# Patient Record
Sex: Male | Born: 1960 | Race: White | Hispanic: No | Marital: Married | Smoking: Never smoker
Health system: Southern US, Community
[De-identification: ages and names within clinical notes are randomized; demographics above are authoritative.]

## PROBLEM LIST (undated history)

## (undated) DIAGNOSIS — I10 Essential (primary) hypertension: Secondary | ICD-10-CM

## (undated) DIAGNOSIS — I639 Cerebral infarction, unspecified: Secondary | ICD-10-CM

## (undated) HISTORY — PX: COLONOSCOPY: SHX174

---

## 2017-01-01 DIAGNOSIS — D1801 Hemangioma of skin and subcutaneous tissue: Secondary | ICD-10-CM | POA: Diagnosis not present

## 2017-01-01 DIAGNOSIS — L570 Actinic keratosis: Secondary | ICD-10-CM | POA: Diagnosis not present

## 2017-07-21 DIAGNOSIS — C44622 Squamous cell carcinoma of skin of right upper limb, including shoulder: Secondary | ICD-10-CM | POA: Diagnosis not present

## 2017-07-21 DIAGNOSIS — L814 Other melanin hyperpigmentation: Secondary | ICD-10-CM | POA: Diagnosis not present

## 2017-07-21 DIAGNOSIS — L570 Actinic keratosis: Secondary | ICD-10-CM | POA: Diagnosis not present

## 2017-07-21 DIAGNOSIS — C44629 Squamous cell carcinoma of skin of left upper limb, including shoulder: Secondary | ICD-10-CM | POA: Diagnosis not present

## 2017-08-06 DIAGNOSIS — D0462 Carcinoma in situ of skin of left upper limb, including shoulder: Secondary | ICD-10-CM | POA: Diagnosis not present

## 2017-08-11 ENCOUNTER — Inpatient Hospital Stay (HOSPITAL_COMMUNITY)
Admission: AD | Admit: 2017-08-11 | Discharge: 2017-08-13 | DRG: 065 | Disposition: A | Discharge status: Home / Self Care | Payer: 59 | Source: Other Acute Inpatient Hospital | Attending: Neurology | Admitting: Neurology

## 2017-08-11 ENCOUNTER — Inpatient Hospital Stay (HOSPITAL_COMMUNITY): Payer: 59

## 2017-08-11 ENCOUNTER — Encounter (HOSPITAL_COMMUNITY): Payer: Self-pay | Admitting: General Practice

## 2017-08-11 DIAGNOSIS — E785 Hyperlipidemia, unspecified: Secondary | ICD-10-CM | POA: Diagnosis present

## 2017-08-11 DIAGNOSIS — Z6838 Body mass index (BMI) 38.0-38.9, adult: Secondary | ICD-10-CM | POA: Diagnosis not present

## 2017-08-11 DIAGNOSIS — R29700 NIHSS score 0: Secondary | ICD-10-CM | POA: Diagnosis present

## 2017-08-11 DIAGNOSIS — E669 Obesity, unspecified: Secondary | ICD-10-CM | POA: Diagnosis present

## 2017-08-11 DIAGNOSIS — T45615A Adverse effect of thrombolytic drugs, initial encounter: Secondary | ICD-10-CM | POA: Diagnosis present

## 2017-08-11 DIAGNOSIS — R1310 Dysphagia, unspecified: Secondary | ICD-10-CM | POA: Diagnosis present

## 2017-08-11 DIAGNOSIS — I69320 Aphasia following cerebral infarction: Secondary | ICD-10-CM | POA: Diagnosis not present

## 2017-08-11 DIAGNOSIS — R471 Dysarthria and anarthria: Secondary | ICD-10-CM | POA: Diagnosis present

## 2017-08-11 DIAGNOSIS — G8191 Hemiplegia, unspecified affecting right dominant side: Secondary | ICD-10-CM | POA: Diagnosis not present

## 2017-08-11 DIAGNOSIS — Z9282 Status post administration of tPA (rtPA) in a different facility within the last 24 hours prior to admission to current facility: Secondary | ICD-10-CM | POA: Diagnosis not present

## 2017-08-11 DIAGNOSIS — Z823 Family history of stroke: Secondary | ICD-10-CM | POA: Diagnosis not present

## 2017-08-11 DIAGNOSIS — R791 Abnormal coagulation profile: Secondary | ICD-10-CM | POA: Diagnosis present

## 2017-08-11 DIAGNOSIS — I161 Hypertensive emergency: Secondary | ICD-10-CM | POA: Diagnosis not present

## 2017-08-11 DIAGNOSIS — I6340 Cerebral infarction due to embolism of unspecified cerebral artery: Principal | ICD-10-CM | POA: Diagnosis present

## 2017-08-11 DIAGNOSIS — R001 Bradycardia, unspecified: Secondary | ICD-10-CM | POA: Diagnosis present

## 2017-08-11 DIAGNOSIS — R29701 NIHSS score 1: Secondary | ICD-10-CM | POA: Diagnosis present

## 2017-08-11 DIAGNOSIS — I160 Hypertensive urgency: Secondary | ICD-10-CM

## 2017-08-11 DIAGNOSIS — R270 Ataxia, unspecified: Secondary | ICD-10-CM | POA: Diagnosis not present

## 2017-08-11 DIAGNOSIS — I10 Essential (primary) hypertension: Secondary | ICD-10-CM | POA: Diagnosis present

## 2017-08-11 DIAGNOSIS — G8194 Hemiplegia, unspecified affecting left nondominant side: Secondary | ICD-10-CM | POA: Diagnosis present

## 2017-08-11 DIAGNOSIS — I6300 Cerebral infarction due to thrombosis of unspecified precerebral artery: Secondary | ICD-10-CM | POA: Diagnosis not present

## 2017-08-11 DIAGNOSIS — R200 Anesthesia of skin: Secondary | ICD-10-CM | POA: Diagnosis not present

## 2017-08-11 DIAGNOSIS — I639 Cerebral infarction, unspecified: Secondary | ICD-10-CM

## 2017-08-11 DIAGNOSIS — R739 Hyperglycemia, unspecified: Secondary | ICD-10-CM | POA: Diagnosis present

## 2017-08-11 LAB — MRSA PCR SCREENING: MRSA BY PCR: NEGATIVE

## 2017-08-11 MED ORDER — ACETAMINOPHEN 325 MG PO TABS
650.0000 mg | ORAL_TABLET | ORAL | Status: DC | PRN
  Administered 2017-08-11 – 2017-08-12 (×2): 650 mg via ORAL
  Filled 2017-08-11 (×2): qty 2

## 2017-08-11 MED ORDER — ACETAMINOPHEN 650 MG RE SUPP: 650.0000 mg | RECTAL | Status: DC | PRN

## 2017-08-11 MED ORDER — ACETAMINOPHEN 160 MG/5ML PO SOLN: 650.0000 mg | ORAL | Status: DC | PRN

## 2017-08-11 MED ORDER — NICARDIPINE HCL IN NACL 20-0.86 MG/200ML-% IV SOLN: 3.0000 mg/h | INTRAVENOUS | Status: DC

## 2017-08-11 MED ORDER — SODIUM CHLORIDE 0.9 % IV SOLN
INTRAVENOUS | Status: DC
  Administered 2017-08-12: 06:00:00 via INTRAVENOUS

## 2017-08-11 MED ORDER — STROKE: EARLY STAGES OF RECOVERY BOOK
Freq: Once | Status: DC
  Filled 2017-08-11: qty 1

## 2017-08-11 MED ORDER — SENNOSIDES-DOCUSATE SODIUM 8.6-50 MG PO TABS: 1.0000 | ORAL_TABLET | Freq: Every evening | ORAL | Status: DC | PRN

## 2017-08-11 NOTE — H&P (Signed)
Stroke Neurology H&P  CC: Transfer from OSH - s/p tPA  History is obtained from: Patient, chart, EMS, [facility] ER telephone call  HPI: Justin [name] is a 56 y.o. male with no past medical history who presented to [facility] with sudden onset of right arm and leg weakness. He reported to the emergency room doctors there that he was working and noticed that he could not move the right hand. He had to focus hard to make his right arm movement. When it started becoming more difficult, that is when he decided to come to the hospital. Also had word finding difficulty. Also complains of headache. He was evaluated by telemedicine neurology or at [facility] ER, and they suspected a stroke. Decision was made to use IV TPA. His systolic blood pressure on presentation was in the 230s for the phone call report. I do not have the telemedicine neurology note to review. TPA bolus was administered, but because of inability to control his blood pressures, decision was made by the team over and round TO not pursue the infusion of TPA. He was transferred to [facility] for higher level of care as they do not have neurology coverage over at [facility].  LKW: 1130 am tpa given?: Bolus given at 1359 hrs-infusion not given because of inability to control blood pressures. This is  report received over the phone Premorbid modified Rankin scale (mRS): 0  ROS: A 14 point ROS was performed and is negative except as noted in the HPI.   No past medical history on file. None  No family history on file. Mother had stroke Sister has had TIAs  Social History:   has no tobacco, alcohol, and drug history on file. Alcohol - 1 beer/day No illicit drugs No tobacco  Medications  Current Facility-Administered Medications:  .   stroke: mapping our early stages of recovery book, , Does not apply, Once, Amie Portland, MD .  0.9 %  sodium chloride infusion, , Intravenous, Continuous, Amie Portland, MD, Last Rate: 75 mL/hr at  08/11/17 1800 .  acetaminophen (TYLENOL) tablet 650 mg, 650 mg, Oral, Q4H PRN **OR** acetaminophen (TYLENOL) solution 650 mg, 650 mg, Per Tube, Q4H PRN **OR** acetaminophen (TYLENOL) suppository 650 mg, 650 mg, Rectal, Q4H PRN, Amie Portland, MD .  nicardipine (CARDENE) 20mg  in 0.86% saline 235ml IV infusion (0.1 mg/ml), 3-15 mg/hr, Intravenous, Continuous, Amie Portland, MD .  senna-docusate (Senokot-S) tablet 1 tablet, 1 tablet, Oral, QHS PRN, Amie Portland, MD   Exam: Current vital signs: BP (!) 177/102   Pulse 88   Temp 97.6 F (36.4 C) (Oral)   Resp 18   Ht 6\' 1"  (1.854 m)   Wt 132 kg (291 lb 0.1 oz)   SpO2 100%   BMI 38.39 kg/m  Vital signs in last 24 hours: Temp:  [97.6 F (36.4 C)] 97.6 F (36.4 C) (10/02 1700) Pulse Rate:  [88-91] 88 (10/02 1800) Resp:  [18-23] 18 (10/02 1800) BP: (162-177)/(89-102) 177/102 (10/02 1800) SpO2:  [97 %-100 %] 100 % (10/02 1800) Weight:  [132 kg (291 lb 0.1 oz)] 132 kg (291 lb 0.1 oz) (10/02 1700)  GENERAL: Awake, alert in NAD HEENT: - Normocephalic and atraumatic, dry mm, no LN++, no Thyromegally LUNGS - Clear to auscultation bilaterally with no wheezes CV - S1S2 RRR, no m/r/g, equal pulses bilaterally. ABDOMEN - Soft, nontender, nondistended with normoactive BS Ext: warm, well perfused, intact peripheral pulses, no edema  NEURO:  Mental Status: AA&Ox3  Language: speech is .  Naming,  repetition, fluency, and comprehension intact Cranial Nerves: PERRL 3 mm/brisk. EOMI, visual fields full, no facial asymmetry, facial sensation intact, hearing intact, tongue/uvula/soft palate midline, normal sternocleidomastoid and trapezius muscle strength. No evidence of tongue atrophy or fibrillations Motor: 5/5 all over Tone: is normal and bulk is normal Sensation- Intact to light touch bilaterally Coordination: FTN intact bilaterally, no ataxia in BLE. Gait- deferred  NIHSS 0    Labs No labs available  Imaging No images available at this  time. Report from outside hospital-noncontrast CT of the head with no evidence of bleed or ischemic changes  Assessment:  56 year old man with no past medical history because he has not seen a doctor in many years, presented to [facility] with sudden onset of right arm and leg weakness. He was evaluated by telemedicine neurology and was given IV TPA-bolus only and then the infusion was aborted because of inability to control his blood pressures, which was running consistently high. He was transferred to [facility] for higher level of care. NIH stroke scale on my evaluation-0.  Impression: Lacunar stroke versus hypertensive urgency  Acuity: Acute Current Suspected Etiology: small vessel disease v HTN Continue Evaluation:  -Admit to: ICU (post tPA protocol although he only received the bolus and not the full dose.) -Blood pressure control, goal of SYS <180 -MRI/ECHO/A1C/Lipid panel. -Hyperglycemia management per SSI to maintain glucose 140-180mg /dL. -PT/OT/ST therapies and recommendations when able  CNS Dysarthria Dysphagia following cerebral infarction  -NPO until cleared by speech -ST -Advance diet as tolerated  Hemiplegia and hemiparesis following cerebral infarction affecting right dominant side -PT/OT  RESP No active issues. Monitor clinically  CV Hypertensive Emergency -Aggressive BP control, goal SBP < 180 -Cardene ordered -Possible underlying essential hypertension that has been untreated or undiagnosed as he has not seen a doctor in many years. -TTE  Hyperlipidemia, unspecified  - Statin for goal LDL < 70  HEME -Monitor -transfuse for hgb < 7  Coagulopathy secondary to tPA at outside hospital -Trend PT/PTT/INR  ENDO -goal HgbA1c < 7  GI/GU -Gentle hydration - and is at 75 an hour  Fluid/Electrolyte Disorders Check labs, replete as necessary.  ID No active issues. 10 vitals and CBC.  Nutrition E66.9 Obesity  -diet  consult  Prophylaxis DVT:  SCDs GI: Not applicable Bowel: Doc- senna  Diet: Clear liquids and advance as tolerated as he is passed bedside swallow.  Code Status: Full Code   -- Amie Portland, MD [facility] [phone]  If 7pm to 7am, please call on call as listed on AMION.  Critical care attestation This patient is critically ill and at significant risk of neurological worsening, death and care requires constant monitoring of vital signs, hemodynamics,respiratory and cardiac monitoring, neurological assessment, discussion with family, other specialists and medical decision making of high complexity. I spent 55  minutes of neurocritical care time  in the care of  this patient.

## 2017-08-12 ENCOUNTER — Inpatient Hospital Stay (HOSPITAL_COMMUNITY): Payer: 59

## 2017-08-12 DIAGNOSIS — I5030 Unspecified diastolic (congestive) heart failure: Secondary | ICD-10-CM

## 2017-08-12 DIAGNOSIS — I6300 Cerebral infarction due to thrombosis of unspecified precerebral artery: Secondary | ICD-10-CM

## 2017-08-12 LAB — HIV ANTIBODY (ROUTINE TESTING W REFLEX): HIV Screen 4th Generation wRfx: NONREACTIVE

## 2017-08-12 LAB — LIPID PANEL
CHOL/HDL RATIO: 4.5 ratio
CHOLESTEROL: 185 mg/dL (ref 0–200)
HDL: 41 mg/dL (ref >40)
LDL Cholesterol: 131 mg/dL — ABNORMAL HIGH (ref 0–99)
TRIGLYCERIDES: 63 mg/dL (ref <150)
VLDL: 13 mg/dL (ref 0–40)

## 2017-08-12 LAB — ECHOCARDIOGRAM COMPLETE
HEIGHTINCHES: 73 in
Weight: 4656.12 oz

## 2017-08-12 LAB — HEMOGLOBIN A1C
Hgb A1c MFr Bld: 5.2 % (ref 4.8–5.6)
MEAN PLASMA GLUCOSE: 102.54 mg/dL

## 2017-08-12 MED ORDER — ASPIRIN EC 81 MG PO TBEC
81.0000 mg | DELAYED_RELEASE_TABLET | Freq: Every day | ORAL | Status: DC
  Administered 2017-08-13: 81 mg via ORAL
  Filled 2017-08-12: qty 1

## 2017-08-12 MED ORDER — LISINOPRIL 2.5 MG PO TABS
2.5000 mg | ORAL_TABLET | Freq: Every day | ORAL | Status: DC
  Administered 2017-08-12 – 2017-08-13 (×2): 2.5 mg via ORAL
  Filled 2017-08-12 (×2): qty 1

## 2017-08-12 MED ORDER — ATORVASTATIN CALCIUM 80 MG PO TABS
80.0000 mg | ORAL_TABLET | Freq: Every day | ORAL | Status: DC
  Administered 2017-08-12: 80 mg via ORAL
  Filled 2017-08-12: qty 1

## 2017-08-12 MED ORDER — ENOXAPARIN SODIUM 40 MG/0.4ML ~~LOC~~ SOLN
40.0000 mg | SUBCUTANEOUS | Status: DC
  Administered 2017-08-13: 40 mg via SUBCUTANEOUS
  Filled 2017-08-12: qty 0.4

## 2017-08-12 NOTE — Evaluation (Addendum)
Occupational Therapy Evaluation Patient Details Name: Justin [name] MRN: [id number] DOB: [birth year] Today's Date: 08/12/2017    History of Present Illness 56 y.o. male admitted as a transfer from [facility] to [facility] on 08/11/17 with right sided weakness s/p partial bolus of TPA, but stopped due to uncontrolled BPs.  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  Pt with no significant PMH listed in chart.    Clinical Impression   Pt admitted with above. He demonstrates the below listed deficits and will benefit from continued OT to maximize safety and independence with BADLs.  Pt presents to OT with mild dysmetria Rt UE, mild balance deficits, and decreased activity tolerance.  He c/o HA with Lt gaze, and mild dizziness with head turns.  No nystagmus noted with pursuits or saccades (PT noted mild nystagmus earlier in day).  He currently requires min guard to min A for ADLs.  He lives with wife, who is very supportive.  Eval limited by BP 208/98 upon standing - RN notified.  Anticipate he will quickly return to Modified independence.  He does note feeling of increased fatigue and wanting to sleep.  Discussed he may need to adjust work schedule initially due to effects of fatigue.  Will follow acutely.       Follow Up Recommendations  No OT follow up;Supervision/Assistance - 24 hour    Equipment Recommendations  Other (comment) (grab bar installed in shower - discussed with pt/wife )    Recommendations for Other Services       Precautions / Restrictions Precautions Precautions: Fall Precaution Comments: mildly unsteady on his feet      Mobility Bed Mobility Overal bed mobility: Modified Independent                Transfers Overall transfer level: Needs assistance Equipment used: None Transfers: Sit to/from Stand Sit to Stand: Supervision         General  transfer comment: close supervision for safety    Balance Overall balance assessment: Needs assistance Sitting-balance support: Feet supported;No upper extremity supported Sitting balance-Leahy Scale: Good     Standing balance support: No upper extremity supported Standing balance-Leahy Scale: Fair Standing balance comment: supervision for static standing                            ADL either performed or assessed with clinical judgement   ADL Overall ADL's : Needs assistance/impaired Eating/Feeding: Independent   Grooming: Wash/dry hands;Wash/dry face;Oral care;Brushing hair;Set up;Sitting   Upper Body Bathing: Set up;Sitting   Lower Body Bathing: Minimal assistance;Sit to/from stand   Upper Body Dressing : Set up;Sitting   Lower Body Dressing: Min guard;Sit to/from stand Lower Body Dressing Details (indicate cue type and reason): pt able to don/doff socks  Toilet Transfer: Min guard;Ambulation;Comfort height toilet;Grab bars   Toileting- Clothing Manipulation and Hygiene: Min guard;Sit to/from stand       Functional mobility during ADLs: Min guard General ADL Comments: Pt states he feels more fatigued than normal.  Discussed that fatigue is common and to notify MD if it interferes with daily activities     Vision Baseline Vision/History: No visual deficits Patient Visual Report: Other (comment) Vision Assessment?: Yes Eye Alignment: Within Functional Limits Ocular Range of Motion: Within Functional Limits Alignment/Gaze Preference: Within Defined Limits Tracking/Visual Pursuits: Able to track stimulus in all quads without difficulty Saccades: Within functional  limits Visual Fields: No apparent deficits Additional Comments: Pt reports headache with Lt gaze.  no nystagmus noted with pursuits, and saccades were WNL.  He reports mild dizziness with head turns      Perception Perception Perception Tested?: Yes   Praxis Praxis Praxis tested?: Within  functional limits    Pertinent Vitals/Pain Pain Assessment: Faces Faces Pain Scale: Hurts a little bit Pain Location: headache with Lt gaze  Pain Descriptors / Indicators: Headache Pain Intervention(s): Monitored during session     Hand Dominance Left   Extremity/Trunk Assessment Upper Extremity Assessment Upper Extremity Assessment: RUE deficits/detail RUE Deficits / Details: very slight dysmetria noted.  He is able to manipulate items in fingertips, and translate items palm <> fingertips    Lower Extremity Assessment Lower Extremity Assessment: Defer to PT evaluation    Cervical / Trunk Assessment Cervical / Trunk Assessment: Normal   Communication Communication Communication: No difficulties   Cognition Arousal/Alertness: Awake/alert Behavior During Therapy: WFL for tasks assessed/performed Overall Cognitive Status: Within Functional Limits for tasks assessed                                     General Comments  Discussed with pt and wife affect of fatigue on function, and that he may need to take rest breaks or naps more than normal, and may need to adjust work schedule initially.   upon standing BP 208/98.  OT eval terminated and pt returned to seated.  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 [employer] Problem List: Impaired balance (sitting and/or standing);Decreased activity  tolerance;Obesity      OT Treatment/Interventions:      OT Goals(Current goals can be found in the care plan section) Acute Rehab OT Goals Patient Stated Goal: to go home as soon as they will let him OT Goal Formulation: With patient/family Time For Goal Achievement: 08/19/17 Potential to Achieve Goals: Good ADL Goals Pt Will Perform Grooming: with modified independence;standing Pt Will Perform Upper Body Bathing: with modified independence;standing Pt Will Perform Lower Body Bathing: with modified independence;sit to/from stand Pt Will Perform Upper Body Dressing: with modified independence;sitting;standing Pt Will Perform Lower Body Dressing: with modified independence;sit to/from stand Pt Will Transfer to Toilet: with modified independence;ambulating;regular height toilet;grab bars Pt Will Perform Toileting - Clothing Manipulation and hygiene: with modified independence;sit to/from stand Pt Will Perform Tub/Shower Transfer: Tub transfer;with supervision;ambulating;grab bars  OT Frequency:     Barriers to D/C:            Co-evaluation              AM-PAC PT "6 Clicks" Daily Activity     Outcome Measure Help from another person eating meals?: None Help from another person taking care of personal grooming?: A Little Help  from another person toileting, which includes using toliet, bedpan, or urinal?: A Little Help from another person bathing (including washing, rinsing, drying)?: A Little Help from another person to put on and taking off regular upper body clothing?: A Little Help from another person to put on and taking off regular lower body clothing?: A Little 6 Click Score: 19   End of Session Nurse Communication: Other (comment) (elevated BP )  Activity Tolerance: Treatment limited secondary to medical complications (Comment) Patient left: in chair;with call bell/phone within reach;with family/visitor present  OT Visit Diagnosis: Unsteadiness on feet (R26.81)                 Time: 8563-1497 OT Time Calculation (min): 28 min Charges:  OT General Charges $OT Visit: 1 Visit OT Evaluation $OT Eval Moderate Complexity: 1 Mod OT Treatments $Therapeutic Activity: 8-22 mins G-Codes:     [facility], OTR/L [phone]   Kimble Delaurentis M 08/12/2017, 7:05 PM

## 2017-08-12 NOTE — Evaluation (Signed)
Physical Therapy Evaluation Patient Details Name: Justin [name] MRN: [id number] DOB: Dec 10, 1960 Today's Date: 08/12/2017   History of Present Illness  56 y.o. male admitted as a transfer from [facility] to [facility] on 08/11/17 with right sided weakness s/p partial bolus of TPA, but stopped due to uncontrolled BPs.  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  Pt with no significant PMH listed in chart.   Clinical Impression  Pt with some mild gait instability and some signs of central dysfunction with visual tracking, but did well with short distance gait. Strength WNL in bil LEs.  Pt would benefit from OP PT f/u for balance and gait training.   PT to follow acutely for deficits listed below.       Follow Up Recommendations Outpatient PT;Supervision for mobility/OOB    Equipment Recommendations  None recommended by PT    Recommendations for Other Services   NA    Precautions / Restrictions Precautions Precautions: Fall Precaution Comments: mildly unsteady on his feet      Mobility  Bed Mobility Overal bed mobility: Modified Independent                Transfers Overall transfer level: Needs assistance Equipment used: None Transfers: Sit to/from Stand Sit to Stand: Supervision         General transfer comment: close supervision for safety  Ambulation/Gait Ambulation/Gait assistance: Min guard Ambulation Distance (Feet): 15 Feet Assistive device: None Gait Pattern/deviations: Step-through pattern;Staggering right;Staggering left     General Gait Details: pt with midlly staggering gait pattern with a goal to get in the bathroom to use the bathroom and take a shower.  Pt mildly unsteady on his feet, but only min guard assist needed for safety and no assistive device.       Modified Rankin (Stroke Patients Only) Modified Rankin (Stroke Patients  Only) Pre-Morbid Rankin Score: No symptoms Modified Rankin: Moderately severe disability     Balance Overall balance assessment: Needs assistance Sitting-balance support: Feet supported;No upper extremity supported Sitting balance-Leahy Scale: Good     Standing balance support: No upper extremity supported Standing balance-Leahy Scale: Good Standing balance comment: deficits noted with gait                             Pertinent Vitals/Pain Pain Assessment: No/denies pain    Home Living Family/patient expects to be discharged to:: Private residence Living Arrangements: Spouse/significant other Available Help at Discharge: Family Type of Home: House Home Access: Stairs to enter Entrance Stairs-Rails: Right Entrance Stairs-Number of Steps: 2 Home Layout: One level Home Equipment: None      Prior Function Level of Independence: Independent         Comments: works     Journalist, newspaper   Dominant Hand: Right    Extremity/Trunk Assessment   Upper Extremity Assessment Upper Extremity Assessment: Defer to OT evaluation    Lower Extremity Assessment Lower Extremity Assessment: RLE deficits/detail RLE Deficits / Details: per pt report right leg was weak with this episode, grossly tested in bed WNL.  Bil leg strength 5/5 per bed level MMT, heel to shin test WNL bil, and sensation intact per pt report (not formally tested).     Cervical / Trunk Assessment Cervical / Trunk Assessment: Normal  Communication   Communication: No difficulties  Cognition Arousal/Alertness: Awake/alert Behavior During Therapy: WFL for tasks assessed/performed Overall  Cognitive Status: Within Functional Limits for tasks assessed                                        General Comments General comments (skin integrity, edema, etc.): smooth persuits revealed several down beats of nystagmus as well as saccadic movement and some intermixed horizontal nystagmus.  Pt  able to do VOR x1 horizontally more symptomatic than vertically, but pt held the target well.         Assessment/Plan    PT Assessment Patient needs continued PT services  PT Problem List Decreased balance;Decreased mobility;Decreased coordination;Decreased knowledge of precautions       PT Treatment Interventions DME instruction;Gait training;Stair training;Functional mobility training;Therapeutic activities;Therapeutic exercise;Balance training;Neuromuscular re-education;Patient/family education    PT Goals (Current goals can be found in the Care Plan section)  Acute Rehab PT Goals Patient Stated Goal: to go home as soon as they will let him PT Goal Formulation: With patient/family Time For Goal Achievement: 08/26/17 Potential to Achieve Goals: Good    Frequency Min 4X/week    AM-PAC PT "6 Clicks" Daily Activity  Outcome Measure Difficulty turning over in bed (including adjusting bedclothes, sheets and blankets)?: None Difficulty moving from lying on back to sitting on the side of the bed? : None Difficulty sitting down on and standing up from a chair with arms (e.g., wheelchair, bedside commode, etc,.)?: None Help needed moving to and from a bed to chair (including a wheelchair)?: A Little Help needed walking in hospital room?: A Little Help needed climbing 3-5 steps with a railing? : A Little 6 Click Score: 21    End of Session Equipment Utilized During Treatment: Gait belt Activity Tolerance: Patient tolerated treatment well Patient left: Other (comment) (in bathroom wife and RN tech in room supervising) Nurse Communication: Mobility status PT Visit Diagnosis: Unsteadiness on feet (R26.81);Other symptoms and signs involving the nervous system (Y69.485)    Time: 4627-0350 PT Time Calculation (min) (ACUTE ONLY): 20 min   Charges:         Wells Guiles B. Shantanique Hodo, PT, DPT [phone]   PT Evaluation $PT Eval Low Complexity: 1 Low     08/12/2017, 5:12 PM

## 2017-08-12 NOTE — Progress Notes (Addendum)
Preliminary results by tech - Carotid Duplex Completed. No evidence of a significant stenosis in bilateral carotid arteries. Vertebral arteries are patent with antegrade flow. Freddi Forster, BS, RDMS, RVT  

## 2017-08-12 NOTE — Progress Notes (Signed)
  Echocardiogram 2D Echocardiogram has been performed.  Justin [name] 08/12/2017, 1:59 PM

## 2017-08-12 NOTE — Plan of Care (Signed)
Problem: Education: Goal: Knowledge of disease or condition will improve Outcome: Completed/Met Date Met: 08/12/17 Discussed with patient the importance of controlling his HTN and cholesterol.

## 2017-08-12 NOTE — Progress Notes (Signed)
STROKE TEAM PROGRESS NOTE   HISTORY OF PRESENT ILLNESS (per record) Justin [name] is a 56 y.o. male with no past medical history who presented to [facility] with sudden onset of right arm and leg weakness. He reported to the emergency room doctors there that he was working and noticed that he could not move the right hand. He had to focus hard to make his right arm movement. When it started becoming more difficult, that is when he decided to come to the hospital. Also had word finding difficulty. Also complains of headache. He was evaluated by telemedicine neurology or at [facility] ER, and they suspected a stroke. Decision was made to use IV TPA. His systolic blood pressure on presentation was in the 230s for the phone call report. I do not have the telemedicine neurology note to review. TPA bolus was administered, but because of inability to control his blood pressures, decision was made by the team over and round TO not pursue the infusion of TPA. He was transferred to [facility] for higher level of care as they do not have neurology coverage over at [facility].  He was admitted to the neuro ICU for close monitoring after partial dose tPA administration.   SUBJECTIVE (INTERVAL HISTORY) His wife is at the bedside. He reports feeling back to normal while in bed since he arrived to [facility] hospital. His primary complaints were dizziness and right sided weakness. He reports his blood pressure had been high prior to this event, but he does not take medication for this at home. He has not been ambulatory with therapy yet.   OBJECTIVE Temp:  [97.6 F (36.4 C)-98.2 F (36.8 C)] 97.7 F (36.5 C) (10/03 0800) Pulse Rate:  [50-100] 50 (10/03 0900) Cardiac Rhythm: Sinus bradycardia (10/03 0800) Resp:  [14-24] 14 (10/03 0900) BP: (128-177)/(63-102) 156/84 (10/03 0900) SpO2:  [92 %-100 %] 98 % (10/03 0900) Weight:  [291 lb 0.1 oz (132 kg)] 291 lb 0.1 oz (132 kg) (10/02 1700)  CBC: No results for  input(s): WBC, NEUTROABS, HGB, HCT, MCV, PLT in the last 168 hours.  Basic Metabolic Panel: No results for input(s): NA, K, CL, CO2, GLUCOSE, BUN, CREATININE, CALCIUM, MG, PHOS in the last 168 hours.  Lipid Panel:     Component Value Date/Time   CHOL 185 08/12/2017 0340   TRIG 63 08/12/2017 0340   HDL 41 08/12/2017 0340   CHOLHDL 4.5 08/12/2017 0340   VLDL 13 08/12/2017 0340   LDLCALC 131 (H) 08/12/2017 0340   HgbA1c:  Lab Results  Component Value Date   HGBA1C 5.2 08/12/2017   Urine Drug Screen: No results found for: LABOPIA, COCAINSCRNUR, LABBENZ, AMPHETMU, THCU, LABBARB  Alcohol Level No results found for: [facility]  IMAGING  Dg Chest Port 1 View  Result Date: 08/11/2017 CLINICAL DATA:  Right-sided numbness with slurred speech EXAM: PORTABLE CHEST 1 VIEW COMPARISON:  None. FINDINGS: No acute consolidation or effusion. Borderline to mild cardiomegaly. Negative for pneumothorax. IMPRESSION: Borderline to mild cardiomegaly.  Negative for edema or infiltrate. Electronically Signed   By: Donavan Foil M.D.   On: 08/11/2017 21:20     PHYSICAL EXAM Exam: Gen: Awake, alert, and in no acute distress            CV: Regular rate and rhythm, no peripheral edema Eyes: PERRL, conjunctivae clear without exudates or hemorrhage Neuro: Detailed Neurologic Exam Speech:    No aphasia, no dysarthria Cognition:   Conversational, following simple and multiple step commands Cranial Nerves:  Extraocular movements are intact. The face is symmetric. Hearing is intact bilaterally. Voice is normal. Shoulder shrug is normal. The tongue is midline and has normal motion without fasciculations.  Motor Observation:    No asymmetry, no atrophy, and no involuntary movements noted. Bilateral hand rapid movements are normal Tone:   Normal tone Strength:   Bilateral upper extremity strength is 5/5, bilateral lower extremity strength is 5/5 Sensation:   Sensation is symmetric and grossly intact  throughout  NIHSS 0  ASSESSMENT/PLAN Mr. Justin [name] is a 56 y.o. male without significant known medical history presenting with right sided weakness and dizziness. He received initial bolus of tPA but was discontinued due to uncontrolled hypertension.  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 Yes; Fluid consistency: Thin  No antithrombotic prior to admission, now on aspirin 81 mg daily  Patient counseled to be compliant with his antithrombotic medications  Ongoing aggressive stroke risk factor management  Therapy recommendations:  pending  Disposition:  pending  Hypertension  Unstable  Permissive hypertension (OK if < 220/120) but gradually normalize in 5-7 days  Long-term BP goal normotensive       Cardene for tight control after tPA but can take oral meds so wean within 24hrs  Hyperlipidemia  Home meds: none  LDL 131, goal < 70  Add atorvastatin 80 mg  Continue statin at discharge  Diabetes  HgbA1c 5.2%, goal < 7.0  Controlled  Other Stroke Risk Factors  ETOH use, advised to drink no more than 1-2 drink(s) a day  Obesity, Body mass index is 38.39 kg/m., recommend weight loss, diet and exercise as appropriate   Obstructive sleep apnea is suspected by history, consider outpatient sleep study  Other Active Problems  -  Hospital day # 1  I have personally examined this patient, reviewed notes, independently viewed imaging studies, participated in medical decision making and plan of care.ROS completed by me personally and pertinent  positives fully documented  I have made any additions or clarifications directly to the above note. He presented to outside hospital and was given IV TPA bolus but infusion was discontinued due to difficulty in controlling blood pressure. He seems to have made good clinical recovery but MRI scan shows embolic right superior cerebellar artery infarct. Even need further evaluation with TEE and cardiac monitoring. Add statin and continue aspirin. Strict blood pressure control and close neurological monitoring as per post TPA protocol. Long discussion with patient on BiPAP at bedside and answered questions.  This patient is critically ill and at significant risk of neurological worsening, death and care requires constant monitoring of vital signs, hemodynamics,respiratory and cardiac monitoring, extensive review of multiple databases, frequent neurological assessment, discussion with family, other specialists and medical decision making of high complexity.I have made any additions or clarifications directly to the above note.This critical care time does not reflect procedure time, or teaching time or supervisory time of PA/NP/Med Resident etc but could involve care discussion time.  I spent 30 minutes of neurocritical care time  in the care of  this patient.    Antony Contras, MD Medical Director [facility] Pager: [phone] 08/12/2017 4:49 PM   To contact Stroke Continuity provider, please refer to [email or web address] After hours, contact General Neurology

## 2017-08-13 ENCOUNTER — Telehealth: Payer: Self-pay | Admitting: Cardiovascular Disease

## 2017-08-13 ENCOUNTER — Other Ambulatory Visit: Payer: Self-pay | Admitting: *Deleted

## 2017-08-13 DIAGNOSIS — I639 Cerebral infarction, unspecified: Secondary | ICD-10-CM

## 2017-08-13 MED ORDER — ASPIRIN 81 MG PO TBEC: 81.0000 mg | DELAYED_RELEASE_TABLET | Freq: Every day | ORAL | 1 refills | Status: AC

## 2017-08-13 MED ORDER — ATORVASTATIN CALCIUM 80 MG PO TABS: 80.0000 mg | ORAL_TABLET | Freq: Every day | ORAL | 5 refills | Status: AC

## 2017-08-13 MED ORDER — LISINOPRIL 10 MG PO TABS: 5.0000 mg | ORAL_TABLET | Freq: Every day | ORAL | 0 refills | Status: DC

## 2017-08-13 NOTE — Progress Notes (Signed)
STROKE TEAM PROGRESS NOTE   HISTORY OF PRESENT ILLNESS (per record) Justin [name] is a 56 y.o. male with no past medical history who presented to [facility] with sudden onset of right arm and leg weakness. He reported to the emergency room doctors there that he was working and noticed that he could not move the right hand. He had to focus hard to make his right arm movement. When it started becoming more difficult, that is when he decided to come to the hospital. Also had word finding difficulty. Also complains of headache. He was evaluated by telemedicine neurology or at [facility] ER, and they suspected a stroke. Decision was made to use IV TPA. His systolic blood pressure on presentation was in the 230s for the phone call report. I do not have the telemedicine neurology note to review. TPA bolus was administered, but because of inability to control his blood pressures, decision was made by the team over and round TO not pursue the infusion of TPA. He was transferred to [facility] for higher level of care as they do not have neurology coverage over at [facility].  He was admitted to the neuro ICU for close monitoring after partial dose tPA administration.   SUBJECTIVE (INTERVAL HISTORY) His wife is at the bedside. He worked well with therapy yesterday but still felt very fatigued. Mild gait instability remains and he thinks it takes longer than usual to move. TEE is not available today but he is very medically stable and otherwise finished tests and evluation yesterday. He is eager to go home and expresses good understanding of his follow up plans.   OBJECTIVE Temp:  [97.6 F (36.4 C)-98.1 F (36.7 C)] 97.6 F (36.4 C) (10/04 0839) Pulse Rate:  [44-75] 75 (10/04 0900) Cardiac Rhythm: Sinus bradycardia (10/04 0800) Resp:  [13-24] 15 (10/04 0900) BP: (101-176)/(58-98) 176/92 (10/04 0900) SpO2:  [88 %-98 %] 97 % (10/04 0900)  CBC: No results for input(s): WBC, NEUTROABS, HGB, HCT, MCV, PLT  in the last 168 hours.  Basic Metabolic Panel: No results for input(s): NA, K, CL, CO2, GLUCOSE, BUN, CREATININE, CALCIUM, MG, PHOS in the last 168 hours.  Lipid Panel:     Component Value Date/Time   CHOL 185 08/12/2017 0340   TRIG 63 08/12/2017 0340   HDL 41 08/12/2017 0340   CHOLHDL 4.5 08/12/2017 0340   VLDL 13 08/12/2017 0340   LDLCALC 131 (H) 08/12/2017 0340   HgbA1c:  Lab Results  Component Value Date   HGBA1C 5.2 08/12/2017   Urine Drug Screen: No results found for: LABOPIA, COCAINSCRNUR, LABBENZ, AMPHETMU, THCU, LABBARB  Alcohol Level No results found for: [facility]  IMAGING  Mr Brain Wo Contrast 08/12/2017 IMPRESSION: 1. Patchy acute infarcts in the right cerebellum, right SCA/AICA territory. 2. Trace petechial hemorrhage (Heidelberg Classification 1a) and mild cerebellar edema without mass effect. 3. Evidence of Basilar Artery atherosclerosis, and decreased flow at the Right AICA origin. No significant basilar stenosis. 4. Negative anterior circulation. Fetal type bilateral PCA origins with mild left P2 segment irregularity and stenosis. 5. Moderate for age nonspecific cerebral white matter signal changes, favor small vessel disease related.  Mr Jodene Nam Head Wo Contrast 08/12/2017 IMPRESSION: 1. Patchy acute infarcts in the right cerebellum, right SCA/AICA territory. 2. Trace petechial hemorrhage (Heidelberg Classification 1a) and mild cerebellar edema without mass effect. 3. Evidence of Basilar Artery atherosclerosis, and decreased flow at the Right AICA origin. No significant basilar stenosis. 4. Negative anterior circulation. Fetal type bilateral PCA origins with  mild left P2 segment irregularity and stenosis. 5. Moderate for age nonspecific cerebral white matter signal changes, favor small vessel disease related.   PHYSICAL EXAM Exam: Gen: Awake, alert, and in no acute distress            CV: Regular rate and rhythm, no peripheral edema Neuro: Detailed Neurologic  Exam Speech:    No aphasia, no dysarthria Cognition:   Conversational, following simple and multiple step commands Cranial Nerves:   Extraocular movements are intact. The face is symmetric. Hearing is intact bilaterally. Voice is normal. Motor Observation:    No asymmetry, no atrophy, and no involuntary movements noted. Bilateral hand rapid movements are normal Sensation:   Sensation is symmetric and grossly intact throughout  NIHSS 0  ASSESSMENT/PLAN Mr. Justin [name] is a 56 y.o. male without significant known medical history presenting with right sided weakness and dizziness. He received initial bolus of tPA but was discontinued due to uncontrolled hypertension.  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 Yes; Fluid consistency: Thin  No antithrombotic prior to admission, now on aspirin 81 mg daily  Patient counseled to be compliant with his antithrombotic medications  Ongoing aggressive stroke risk factor management  Therapy recommendations:  Outpatient PT/OT  Disposition:  To home  Hypertension  Treated to < 180 here after tPA  Long-term BP goal normotensive < 130/90       He will likely need outpatient therapy started with PCP follow up  Hyperlipidemia  Home meds: none  LDL 131, goal < 70  Add atorvastatin 80 mg  Continue statin at discharge  Diabetes  HgbA1c 5.2%, goal < 7.0  Other Stroke Risk Factors  ETOH use, advised to drink no more than 1-2 drink(s) a day  Obesity, Body mass index is 38.39 kg/m., recommend  weight loss, diet and exercise as appropriate   Obstructive sleep apnea is suspected by history, consider outpatient sleep study  Plan  His stroke is consistent with embolic etiology. No findings on 2D echocardiogram. He will need outpatient follow up for TEE/loop and is agreeable.  Hospital day # [place], MD PGY-III Internal Medicine Resident Pager# [phone] 08/13/2017, 10:29 AM  I have personally examined this patient, reviewed notes, independently viewed imaging studies, participated in medical decision making and plan of care.ROS completed by me personally and pertinent positives fully documented  I have made any additions or clarifications directly to the above note. Agree with note above. Discharge home today and return next week for outpatient TEE and loop recorder insertion.  Antony Contras, MD Medical Director [facility] Pager: [phone] 08/13/2017 3:08 PM   To contact Stroke Continuity provider, please refer to [email or web address] After hours, contact General Neurology

## 2017-08-13 NOTE — Telephone Encounter (Signed)
New message    Pt wife is calling to schedule TEE that was scheduled and canceled. Please call.

## 2017-08-13 NOTE — Discharge Summary (Signed)
Stroke Discharge Summary  Patient ID: Justin [name]   MRN: [id number]      DOB: 1961/06/13  Date of Admission: 08/11/2017 Date of Discharge: 08/13/2017  Attending Physician:  Garvin Fila, MD, Stroke MD Consultant(s):     Patient's PCP:  No primary care provider on file.  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 No pertinent past medical history. History reviewed. No pertinent surgical history.  Allergies as of 08/13/2017      Reactions   Ciprofloxacin Anaphylaxis, Swelling   Penicillins Anaphylaxis, Swelling   Has patient had a PCN reaction causing immediate rash, facial/tongue/throat swelling, SOB or lightheadedness with hypotension: Yes Has patient had a PCN reaction causing severe rash involving mucus membranes or skin necrosis: Yes Has patient had a PCN reaction that required hospitalization: Yes Has patient had a PCN reaction occurring within the last 10 years: Yes If all of the above answers are "NO", then may proceed with Cephalosporin use.      Medication List    TAKE these medications   aspirin 81 MG EC tablet Take 1 tablet (81 mg total) by mouth daily.   atorvastatin 80 MG tablet Commonly known as:  LIPITOR Take 1 tablet (80 mg total) by mouth daily at 6 PM.   lisinopril 10 MG tablet Commonly known as:  PRINIVIL,ZESTRIL Take 0.5 tablets (5 mg total) by mouth daily.       LABORATORY STUDIES CBCNo results found for: WBC, RBC, HGB, HCT, PLT, MCV, MCH, MCHC, RDW, LYMPHSABS, MONOABS, EOSABS, BASOSABS CMPNo results found for: NA, K, CL, CO2, GLUCOSE, BUN, CREATININE, CALCIUM, PROT, ALBUMIN, AST, ALT, ALKPHOS, BILITOT, GFRNONAA, GFRAA COAGSNo results found for: INR, PROTIME Lipid Panel    Component Value Date/Time   CHOL 185 08/12/2017 0340   TRIG 63 08/12/2017 0340   HDL 41 08/12/2017 0340   CHOLHDL 4.5 08/12/2017 0340   VLDL 13  08/12/2017 0340   LDLCALC 131 (H) 08/12/2017 0340   HgbA1C  Lab Results  Component Value Date   HGBA1C 5.2 08/12/2017   Urinalysis No results found for: COLORURINE, APPEARANCEUR, LABSPEC, PHURINE, GLUCOSEU, HGBUR, BILIRUBINUR, KETONESUR, PROTEINUR, UROBILINOGEN, NITRITE, LEUKOCYTESUR Urine Drug Screen No results found for: LABOPIA, COCAINSCRNUR, LABBENZ, AMPHETMU, THCU, LABBARB  Alcohol Level No results found for: [facility]   SIGNIFICANT DIAGNOSTIC STUDIES Mr Brain Wo Contrast 08/12/2017 IMPRESSION: 1. Patchy acute infarcts in the right cerebellum, right SCA/AICA territory. 2. Trace petechial hemorrhage (Heidelberg Classification 1a) and mild cerebellar edema without mass effect. 3. Evidence of Basilar Artery atherosclerosis, and decreased flow at the Right AICA origin. No significant basilar stenosis. 4. Negative anterior circulation. Fetal type bilateral PCA origins with mild left P2 segment irregularity and stenosis. 5. Moderate for age nonspecific cerebral white matter signal changes, favor small vessel disease related.  Mr Justin [name] Head Wo Contrast 08/12/2017 IMPRESSION: 1. Patchy acute infarcts in the right cerebellum, right SCA/AICA territory. 2. Trace petechial hemorrhage (Heidelberg Classification 1a) and mild cerebellar edema without mass effect. 3. Evidence of Basilar Artery atherosclerosis, and decreased flow at the Right AICA origin. No significant basilar stenosis. 4. Negative anterior circulation. Fetal type bilateral PCA origins with mild left P2 segment irregularity and stenosis. 5. Moderate for age nonspecific cerebral white matter signal changes, favor small vessel disease related.     HISTORY OF PRESENT ILLNESS Justin [name] a 56 y.o.malewith no past medical history who presented to [facility] with sudden  onset of right arm and leg weakness. He reported to the emergency room doctors there that he was working and noticed that he could not move the right hand. He had to  focus hard to make his right arm movement. When it started becoming more difficult, that is when he decided to come to the hospital. Also had word finding difficulty. Also complains of headache. He was evaluated by telemedicine neurology or at [facility] ER, and they suspected a stroke. Decision was made to use IV TPA. His systolic blood pressure on presentation was in the 230s for the phone call report. I do not have the telemedicine neurology note to review. TPA bolus was administered, but because of inability to control his blood pressures, decision was made by the team over and round TO not pursue the infusion of TPA. He was transferred to [facility] for higher level of care as they do not have neurology coverage over at [facility].  He was admitted to the neuro ICU for close monitoring after partial dose tPA administration.  HOSPITAL COURSE Admitted to neuro ICU for monitoring after partial dose of tPA administered and hypertension >180 SBP. MRI head demonstrated right superior cerebellar embolic stroke consistent with his residual deficit in gait and coordination. No large vessel disease or cardioembolic source was identified on 2D echocardiogram. TEE and loop recorder are indicated but he wished to not remain inpatient for any extra days to pursue this prior to discharge. He participated with therapy prior to discharge home as the recommended level of care. Medical problems identified during the admission also included hypertension and hyperlipidemia.  DISCHARGE EXAM Blood pressure (!) 176/92, pulse 75, temperature 97.6 F (36.4 C), temperature source Oral, resp. rate 15, height 6\' 1"  (1.854 m), weight 291 lb 0.1 oz (132 kg), SpO2 97 %. Exam: Gen: Awake, alert, and in no acute distress            CV: Regular rate and rhythm, no peripheral edema Neuro: Detailed Neurologic Exam Speech:    No aphasia, no dysarthria Cognition:   Conversational, following simple and multiple step commands Cranial  Nerves:   Extraocular movements are intact. The face is symmetric. Hearing is intact bilaterally. Voice is normal. Motor Observation:    No asymmetry, no atrophy, and no involuntary movements noted. Bilateral hand rapid movements are normal Sensation:   Sensation is symmetric and grossly intact throughout   Discharge Diet   Diet Heart Room service appropriate? Yes; Fluid consistency: Thin Diet - low sodium heart healthy liquids  DISCHARGE PLAN  Disposition:  To home for outpatient therapy  aspirin 325 mg daily for secondary stroke prevention.  Ongoing risk factor control by Primary Care Physician at time of discharge - hypertension, consider study for OSA  Follow-up No primary care provider on file. in 2 weeks, he reports having a PCP that has not seen lately.  Follow-up with Dr. Antony Contras, Stroke Clinic in 6 weeks, office to schedule an appointment.  Follow up with [facility] for visit and outpatient TEE/loop recorder due to embolic pattern of stroke.  35 minutes were spent preparing discharge.   Collier [place]ina, MD PGY-III Internal Medicine Resident Pager# [phone] 08/13/2017, 12:55 PM I have personally examined this patient, reviewed notes, independently viewed imaging studies, participated in medical decision making and plan of care.ROS completed by me personally and pertinent positives fully documented  I have made any additions or clarifications directly to the above note. Agree with note above.   Antony Contras,  MD Medical Director [facility] Pager: [phone] 08/13/2017 3:09 PM

## 2017-08-13 NOTE — Telephone Encounter (Signed)
Spoke with pt wife, TEE scheduled for 08-18-17 @ 9 am with dr Debara Pickett. Instructions discussed with wife in detail. Orders placed.

## 2017-08-13 NOTE — Progress Notes (Signed)
Physical Therapy Treatment Patient Details Name: Justin [name] MRN: [id number] DOB: May 19, 1961 Today's Date: 08/13/2017    History of Present Illness 56 y.o. male admitted as a transfer from [facility] to [facility] on 08/11/17 with right sided weakness s/p partial bolus of TPA, but stopped due to uncontrolled BPs.  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  Pt with no significant PMH listed in chart.     PT Comments    Pt is progressing well with mobility, but when challenged, deficits are evident most symptomatic with sit to stand transitions and horizontal head turns during gait.  He would benefit from [facility] for help with balance especially in the community and I still think he would be appropriate for PT in the OP setting for higher level dynamic balance training and gait training to help him to return to as close to PLOF as he can get.   Follow Up Recommendations  Outpatient PT;Supervision for mobility/OOB     Equipment Recommendations  Cane    Recommendations for Other Services   NA     Precautions / Restrictions Precautions Precautions: Fall Precaution Comments: mildly unsteady on his feet    Mobility  Bed Mobility               General bed mobility comments: Pt was OOB in the recliner chair  Transfers Overall transfer level: Needs assistance Equipment used: None Transfers: Sit to/from Stand Sit to Stand: Supervision         General transfer comment: supervision for safety.  Pt reports sit to stand transitions make him a bit dizzy.  Advised for supine to sit and sit to stand transitions to pause a few seconds to a minute before preceding with his next move.   Ambulation/Gait Ambulation/Gait assistance: Supervision Ambulation Distance (Feet): 300 Feet Assistive device: None Gait Pattern/deviations: Step-through pattern;Staggering left;Staggering  right Gait velocity: decreased Gait velocity interpretation: Below normal speed for age/gender General Gait Details: Pt with mildly staggering gait pattern with cautious speed.  Most challenged by horizontal head turns.  Does well with turning as long as it is slow and pt reporting he would feel more comfortable with a cane.    Stairs Stairs: Yes   Stair Management: One rail Left;Alternating pattern;Forwards Number of Stairs: 10 General stair comments: No problem with alternating pattern, must use rail, slower speed.       Modified Rankin (Stroke Patients Only) Modified Rankin (Stroke Patients Only) Pre-Morbid Rankin Score: No symptoms Modified Rankin: Moderately severe disability     Balance Overall balance assessment: Needs assistance Sitting-balance support: Feet supported;No upper extremity supported Sitting balance-Leahy Scale: Good Sitting balance - Comments: able to donn socks, shoes, pants, underwear from sitting position.    Standing balance support: No upper extremity supported Standing balance-Leahy Scale: Good Standing balance comment: able to preform dynamic tasks, but deficts noted.                 Standardized Balance Assessment Standardized Balance Assessment : Dynamic Gait Index   Dynamic Gait Index Level Surface: Normal Change in Gait Speed: Mild Impairment Gait with Horizontal Head Turns: Mild Impairment Gait with Vertical Head Turns: Normal Gait and Pivot Turn: Mild Impairment Step Over Obstacle: Mild Impairment Step Around Obstacles: Normal Steps: Mild Impairment Total Score: 19      Cognition Arousal/Alertness: Awake/alert Behavior During Therapy: WFL for tasks assessed/performed Overall Cognitive Status: Within Functional Limits for tasks  assessed                                           General Comments General comments (skin integrity, edema, etc.): Educated on BEFAST s/sx of a stroke and why it is important to  get to the hospital quickly.       Pertinent Vitals/Pain Pain Assessment: No/denies pain           PT Goals (current goals can now be found in the care plan section) Acute Rehab PT Goals Patient Stated Goal: to go home today Progress towards PT goals: Progressing toward goals    Frequency    Min 4X/week      PT Plan Current plan remains appropriate       AM-PAC PT "6 Clicks" Daily Activity  Outcome Measure  Difficulty turning over in bed (including adjusting bedclothes, sheets and blankets)?: None Difficulty moving from lying on back to sitting on the side of the bed? : None Difficulty sitting down on and standing up from a chair with arms (e.g., wheelchair, bedside commode, etc,.)?: None Help needed moving to and from a bed to chair (including a wheelchair)?: None Help needed walking in hospital room?: None Help needed climbing 3-5 steps with a railing? : None 6 Click Score: 24    End of Session Equipment Utilized During Treatment: Gait belt Activity Tolerance: Patient tolerated treatment well Patient left: in chair;with call bell/phone within reach;with family/visitor present   PT Visit Diagnosis: Unsteadiness on feet (R26.81);Other symptoms and signs involving the nervous system (Y78.295)     Time: 6213-0865 PT Time Calculation (min) (ACUTE ONLY): 34 min  Charges:  $Gait Training: 23-37 mins    Odin Mariani B. [place], [place], DPT [phone]            08/13/2017, 11:21 PM

## 2017-08-13 NOTE — Progress Notes (Signed)
Occupational Therapy Treatment and Discharge Patient Details Name: Justin [name] MRN: [id number] DOB: 12-Mar-1961 Today's Date: 08/13/2017    History of present illness 56 y.o. male admitted as a transfer from [facility] to [facility] on 08/11/17 with right sided weakness s/p partial bolus of TPA, but stopped due to uncontrolled BPs.  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  Pt with no significant PMH listed in chart.    OT comments  This 56 yo male seen today for treatment session and is doing much better than on eval with pt close to baseline now. No further OT needs, and pt only at S level due to one report of being slightly off balance when walking in hallway earlier with PT ( no LOB with me having him up and about in his room).  Follow Up Recommendations  No OT follow up;Supervision/Assistance - 24 hour          Precautions / Restrictions Precautions Precautions: Fall Precaution Comments: mildly unsteady on his feet Restrictions Weight Bearing Restrictions: No       Mobility Bed Mobility               General bed mobility comments: Pt up in recliner upon my entering room  Transfers Overall transfer level: Needs assistance   Transfers: Sit to/from Stand Sit to Stand: Supervision                  ADL either performed or assessed with clinical judgement   ADL                                         General ADL Comments: Needs S due to still slightly unsteady when on feet. He could stand on one leg while he raised other to place on shower seat in bathroom (on one occassion he used the wall to steady himself as he did this, but then for the other leg he did not). Did his LBD while PT In room and no problems reported--had him untie and retie one of his shoes due to incoordinaton on eval (no present today).      Vision   Additional  Comments: No reports of headache with left gaze today          Cognition Arousal/Alertness: Awake/alert Behavior During Therapy: WFL for tasks assessed/performed Overall Cognitive Status: Within Functional Limits for tasks assessed                                                     Pertinent Vitals/ Pain       Pain Assessment: No/denies pain            Progress Toward Goals  OT Goals(current goals can now be found in the care plan section)  Progress towards OT goals: Goals met/education completed, patient discharged from [facility] Discharge plan remains appropriate       AM-PAC PT "6 Clicks" Daily Activity     Outcome Measure   Help from another person eating meals?: None Help from another person taking care of personal grooming?: None Help from another person toileting, which includes using toliet, bedpan, or urinal?:  None Help from another person bathing (including washing, rinsing, drying)?: None Help from another person to put on and taking off regular upper body clothing?: None Help from another person to put on and taking off regular lower body clothing?: None 6 Click Score: 24    End of Session    OT Visit Diagnosis: Unsteadiness on feet (R26.81)   Activity Tolerance Patient tolerated treatment well   Patient Left in chair;with call bell/phone within reach;with family/visitor present   Nurse Communication  (pt ready to go from therapy standpoint)        Time: 1047-1100 OT Time Calculation (min): 13 min  Charges: OT General Charges $OT Visit: 1 Visit OT Treatments $Self Care/Home Management : 8-22 mins  [place], OTR/L [phone] 08/13/2017

## 2017-08-14 SURGERY — ECHOCARDIOGRAM, TRANSESOPHAGEAL
Anesthesia: Moderate Sedation

## 2017-08-16 ENCOUNTER — Emergency Department (HOSPITAL_COMMUNITY)
Admission: EM | Admit: 2017-08-16 | Discharge: 2017-08-16 | Disposition: A | Discharge status: Home / Self Care | Payer: 59 | Attending: Emergency Medicine | Admitting: Emergency Medicine

## 2017-08-16 ENCOUNTER — Encounter (HOSPITAL_COMMUNITY): Payer: Self-pay | Admitting: *Deleted

## 2017-08-16 DIAGNOSIS — I10 Essential (primary) hypertension: Secondary | ICD-10-CM | POA: Diagnosis not present

## 2017-08-16 HISTORY — DX: Essential (primary) hypertension: I10

## 2017-08-16 HISTORY — DX: Cerebral infarction, unspecified: I63.9

## 2017-08-16 LAB — CBC WITH DIFFERENTIAL/PLATELET
BASOS ABS: 0 10*3/uL (ref 0.0–0.1)
Basophils Relative: 0 %
EOS ABS: 0.2 10*3/uL (ref 0.0–0.7)
Eosinophils Relative: 2 %
HCT: 48.8 % (ref 39.0–52.0)
HEMOGLOBIN: 16.6 g/dL (ref 13.0–17.0)
LYMPHS ABS: 2.7 10*3/uL (ref 0.7–4.0)
LYMPHS PCT: 25 %
MCH: 31.3 pg (ref 26.0–34.0)
MCHC: 34 g/dL (ref 30.0–36.0)
MCV: 92.1 fL (ref 78.0–100.0)
Monocytes Absolute: 0.8 10*3/uL (ref 0.1–1.0)
Monocytes Relative: 7 %
NEUTROS PCT: 66 %
Neutro Abs: 6.8 10*3/uL (ref 1.7–7.7)
Platelets: 213 10*3/uL (ref 150–400)
RBC: 5.3 MIL/uL (ref 4.22–5.81)
RDW: 12.9 % (ref 11.5–15.5)
WBC: 10.5 10*3/uL (ref 4.0–10.5)

## 2017-08-16 LAB — COMPREHENSIVE METABOLIC PANEL
ALBUMIN: 3.7 g/dL (ref 3.5–5.0)
ALT: 70 U/L — ABNORMAL HIGH (ref 17–63)
ANION GAP: 8 (ref 5–15)
AST: 41 U/L (ref 15–41)
Alkaline Phosphatase: 85 U/L (ref 38–126)
BILIRUBIN TOTAL: 1.3 mg/dL — AB (ref 0.3–1.2)
BUN: 12 mg/dL (ref 6–20)
CO2: 28 mmol/L (ref 22–32)
Calcium: 9.4 mg/dL (ref 8.9–10.3)
Chloride: 105 mmol/L (ref 101–111)
Creatinine, Ser: 1 mg/dL (ref 0.61–1.24)
GFR calc Af Amer: >60 mL/min (ref >60)
GFR calc non Af Amer: >60 mL/min (ref >60)
GLUCOSE: 97 mg/dL (ref 65–99)
POTASSIUM: 4 mmol/L (ref 3.5–5.1)
SODIUM: 141 mmol/L (ref 135–145)
TOTAL PROTEIN: 7.5 g/dL (ref 6.5–8.1)

## 2017-08-16 MED ORDER — ACETAMINOPHEN 325 MG PO TABS
650.0000 mg | ORAL_TABLET | Freq: Once | ORAL | Status: AC
  Administered 2017-08-16: 650 mg via ORAL
  Filled 2017-08-16: qty 2

## 2017-08-16 MED ORDER — LISINOPRIL 10 MG PO TABS: 10.0000 mg | ORAL_TABLET | Freq: Every day | ORAL | 0 refills | Status: DC

## 2017-08-16 MED ORDER — HYDROCHLOROTHIAZIDE 25 MG PO TABS: 25.0000 mg | ORAL_TABLET | Freq: Every day | ORAL | 0 refills | Status: AC

## 2017-08-16 MED ORDER — HYDROCHLOROTHIAZIDE 12.5 MG PO CAPS
12.5000 mg | ORAL_CAPSULE | Freq: Every day | ORAL | Status: DC
  Administered 2017-08-16: 12.5 mg via ORAL
  Filled 2017-08-16: qty 1

## 2017-08-16 MED ORDER — HYDRALAZINE HCL 25 MG PO TABS
25.0000 mg | ORAL_TABLET | Freq: Once | ORAL | Status: AC
  Administered 2017-08-16: 25 mg via ORAL
  Filled 2017-08-16: qty 1

## 2017-08-16 NOTE — ED Provider Notes (Signed)
[facility] DEPT Provider Note   CSN: [id number] Arrival date & time: 08/16/17  8527     History   Chief Complaint Chief Complaint  Patient presents with  . Hypertension    HPI Justin [name] is a 56 y.o. male.  The history is provided by the patient. No language interpreter was used.  Hypertension  This is a new problem. The problem occurs constantly. The problem has not changed since onset.Pertinent negatives include no chest pain. Nothing aggravates the symptoms. Nothing relieves the symptoms. He has tried nothing for the symptoms. The treatment provided no relief.  Pt reports he has continued to have high blood pressure.  Pt was placed on lisinoptil 5mg  a day.  Pt reports blood pressure has been high multiple times.  Pt was discharged from here on 10/4 after a cva.  Pt is scheduled to see a primary Md OCt 17.   Past Medical History:  Diagnosis Date  . Hypertension   . Stroke [facility])     Patient Active Problem List   Diagnosis Date Noted  . 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 No pertinent surgical history.     Home Medications    Prior to Admission medications   Medication Sig Start Date End Date Taking? Authorizing Provider  aspirin EC 81 MG EC tablet Take 1 tablet (81 mg total) by mouth daily. 08/14/17   Collier Salina, MD  atorvastatin (LIPITOR) 80 MG tablet Take 1 tablet (80 mg total) by mouth daily at 6 PM. 08/13/17   Rice, Resa Miner, MD  lisinopril (PRINIVIL,ZESTRIL) 10 MG tablet Take 0.5 tablets (5 mg total) by mouth daily. 08/14/17   Collier Salina, MD    Family History History reviewed. No pertinent family history.  Social History Social History  Substance Use Topics  . Smoking status: Never Smoker  . Smokeless tobacco: Never Used  . Alcohol use Not on file     Allergies   Ciprofloxacin and Penicillins   Review of Systems Review of Systems  Cardiovascular: Negative for chest pain.  All other systems reviewed  and are negative.    Physical Exam Updated Vital Signs BP (!) 168/92   Pulse (!) 50   Temp (!) 97.3 F (36.3 C) (Oral)   Resp 18   SpO2 97%   Physical Exam  Constitutional: He appears well-developed and well-nourished.  HENT:  Head: Normocephalic and atraumatic.  Right Ear: External ear normal.  Left Ear: External ear normal.  Mouth/Throat: Oropharynx is clear and moist.  Eyes: Conjunctivae are normal.  Neck: Neck supple.  Cardiovascular: Normal rate and regular rhythm.   No murmur heard. Pulmonary/Chest: Effort normal and breath sounds normal. No respiratory distress.  Abdominal: Soft. There is no tenderness.  Musculoskeletal: He exhibits no edema.  Neurological: He is alert.  Skin: Skin is warm and dry.  Psychiatric: He has a normal mood and affect.  Nursing note and vitals reviewed.    ED Treatments / Results  Labs (all labs ordered are listed, but only abnormal results are displayed) Labs Reviewed  COMPREHENSIVE METABOLIC PANEL - Abnormal; Notable for the following:       Result Value   ALT 70 (*)    Total Bilirubin 1.3 (*)    All other components within normal limits  CBC WITH DIFFERENTIAL/PLATELET    EKG  EKG Interpretation None       Radiology No results found.  Procedures Procedures (including critical care time)  Medications Ordered in ED Medications  hydrALAZINE (APRESOLINE) tablet 25 mg (not administered)  acetaminophen (TYLENOL) tablet 650 mg (not administered)  hydrochlorothiazide (MICROZIDE) capsule 12.5 mg (not administered)     Initial Impression / Assessment and Plan / ED Course  I have reviewed the triage vital signs and the nursing notes.  Pertinent labs & imaging results that were available during my care of the patient were reviewed by me and considered in my medical decision making (see chart for details).     Pt counseled about blood pressure and life style changes.  Pt given hctz and hydralazine here.  Blood pressure  decreased.   Pt given rx for hctz and advised to take lisinopril 10 mg a day.   Final Clinical Impressions(s) / ED Diagnoses   Final diagnoses:  Essential hypertension    New Prescriptions New Prescriptions   HYDROCHLOROTHIAZIDE (HYDRODIURIL) 25 MG TABLET    Take 1 tablet (25 mg total) by mouth daily.   LISINOPRIL (PRINIVIL,ZESTRIL) 10 MG TABLET    Take 1 tablet (10 mg total) by mouth daily.  An After Visit Summary was printed and given to the patient.    [name] 08/16/17 1121    Noemi Chapel, MD 08/18/17 2009    Noemi Chapel, MD 08/18/17 2011

## 2017-08-16 NOTE — ED Provider Notes (Signed)
Medical screening examination/treatment/procedure(s) were conducted as a shared visit with non-physician practitioner(s) and myself.  I personally evaluated the patient during the encounter.  Clinical Impression:   Final diagnoses:  Essential hypertension      The patient is a 56 year old male, the patient has not had medical care for many years until he recently had a stroke diagnosed by CT scan and MRI and was discharged from the hospital several days ago. He had severe hypertension which prevented him from getting a full dose of thrombolytics. While he was in the hospital he had an MRI which confirmed a stroke and he was discharged on lisinopril 5 mg. Over the last couple of days he has had persistent headaches as well as persistent hypertension which has been up to 270 systolic at home. The patient was noted that he has had persistent headaches but no recurrent neurologic symptoms.  On exam the patient currently has a blood pressure of 180/90, normal pulses, no significant edema, no JVD, no carotid bruit, clear heart and lung sounds, mild bradycardia at 58 bpm. His neurologic exam is unremarkable with clear vision, cranial nerves III through XII are normal, strength in all 4 extremities is normal, sensation all 4 extremities is normal and coordination is normal by finger-nose better.  Check labs, control blood pressure better as we increase lisinopril from 5-10 mg (wife gave that dose this morning), and hydrochlorothiazide, patient instructed on taking blood pressure once a day, patient and wife instructed on course of hypertension gradually reducing over time rather than rapidly reducing. Expression of understanding given by pt and family.   Noemi Chapel, MD 08/18/17 2009

## 2017-08-16 NOTE — ED Triage Notes (Signed)
Pt reports being in hospital recently due to HTN and stroke. Has intermittent dizziness and slurred speech as residual from stroke. Reports having high bp for several days despite taking his meds and reports posterior headache. No new neuro deficits reported.

## 2017-08-16 NOTE — Discharge Instructions (Signed)
See your Physician for recheck as scheduled.  Take lisinopril 10 mg a day.  Your Physician will need to recheck your potassium

## 2017-08-18 ENCOUNTER — Encounter (HOSPITAL_COMMUNITY)
Admission: RE | Disposition: A | Discharge status: Home / Self Care | Payer: Self-pay | Source: Ambulatory Visit | Attending: Internal Medicine

## 2017-08-18 ENCOUNTER — Ambulatory Visit (HOSPITAL_COMMUNITY)
Admission: RE | Admit: 2017-08-18 | Discharge: 2017-08-18 | Disposition: A | Discharge status: Home / Self Care | Payer: 59 | Source: Ambulatory Visit | Attending: Internal Medicine | Admitting: Internal Medicine

## 2017-08-18 ENCOUNTER — Encounter (HOSPITAL_COMMUNITY): Payer: Self-pay | Admitting: *Deleted

## 2017-08-18 ENCOUNTER — Ambulatory Visit (HOSPITAL_COMMUNITY): Admit: 2017-08-18 | Payer: 59 | Admitting: Internal Medicine

## 2017-08-18 ENCOUNTER — Ambulatory Visit (HOSPITAL_BASED_OUTPATIENT_CLINIC_OR_DEPARTMENT_OTHER)
Admission: RE | Admit: 2017-08-18 | Discharge: 2017-08-18 | Disposition: A | Discharge status: Home / Self Care | Payer: 59 | Source: Ambulatory Visit | Attending: Internal Medicine | Admitting: Internal Medicine

## 2017-08-18 DIAGNOSIS — I10 Essential (primary) hypertension: Secondary | ICD-10-CM | POA: Insufficient documentation

## 2017-08-18 DIAGNOSIS — Z823 Family history of stroke: Secondary | ICD-10-CM | POA: Insufficient documentation

## 2017-08-18 DIAGNOSIS — I639 Cerebral infarction, unspecified: Secondary | ICD-10-CM | POA: Diagnosis present

## 2017-08-18 DIAGNOSIS — I6389 Other cerebral infarction: Secondary | ICD-10-CM

## 2017-08-18 DIAGNOSIS — Z7982 Long term (current) use of aspirin: Secondary | ICD-10-CM | POA: Insufficient documentation

## 2017-08-18 DIAGNOSIS — Z880 Allergy status to penicillin: Secondary | ICD-10-CM | POA: Diagnosis not present

## 2017-08-18 DIAGNOSIS — Z79899 Other long term (current) drug therapy: Secondary | ICD-10-CM | POA: Insufficient documentation

## 2017-08-18 DIAGNOSIS — I4891 Unspecified atrial fibrillation: Secondary | ICD-10-CM

## 2017-08-18 HISTORY — PX: TEE WITHOUT CARDIOVERSION: SHX5443

## 2017-08-18 HISTORY — PX: LOOP RECORDER INSERTION: EP1214

## 2017-08-18 SURGERY — LOOP RECORDER INSERTION

## 2017-08-18 SURGERY — ECHOCARDIOGRAM, TRANSESOPHAGEAL
Anesthesia: Moderate Sedation

## 2017-08-18 MED ORDER — MIDAZOLAM HCL 5 MG/ML IJ SOLN
INTRAMUSCULAR | Status: AC
  Filled 2017-08-18: qty 2

## 2017-08-18 MED ORDER — MIDAZOLAM HCL 10 MG/2ML IJ SOLN
INTRAMUSCULAR | Status: DC | PRN
  Administered 2017-08-18: 1 mg via INTRAVENOUS
  Administered 2017-08-18: 3 mg via INTRAVENOUS
  Administered 2017-08-18: 1 mg via INTRAVENOUS

## 2017-08-18 MED ORDER — SODIUM CHLORIDE 0.9 % IV SOLN: INTRAVENOUS | Status: DC

## 2017-08-18 MED ORDER — LIDOCAINE-EPINEPHRINE 1 %-1:100000 IJ SOLN
INTRAMUSCULAR | Status: AC
  Filled 2017-08-18: qty 1

## 2017-08-18 MED ORDER — LIDOCAINE-EPINEPHRINE 1 %-1:100000 IJ SOLN
INTRAMUSCULAR | Status: DC | PRN
  Administered 2017-08-18: 20 mL

## 2017-08-18 MED ORDER — SODIUM CHLORIDE 0.9% FLUSH: 3.0000 mL | INTRAVENOUS | Status: DC | PRN

## 2017-08-18 MED ORDER — BUTAMBEN-TETRACAINE-BENZOCAINE 2-2-14 % EX AERO
INHALATION_SPRAY | CUTANEOUS | Status: DC | PRN
  Administered 2017-08-18: 2 via TOPICAL

## 2017-08-18 MED ORDER — SODIUM CHLORIDE 0.9% FLUSH: 3.0000 mL | Freq: Two times a day (BID) | INTRAVENOUS | Status: DC

## 2017-08-18 MED ORDER — SODIUM CHLORIDE 0.9 % IV SOLN: 250.0000 mL | INTRAVENOUS | Status: DC | PRN

## 2017-08-18 MED ORDER — FENTANYL CITRATE (PF) 100 MCG/2ML IJ SOLN
INTRAMUSCULAR | Status: DC | PRN
  Administered 2017-08-18 (×3): 25 ug via INTRAVENOUS

## 2017-08-18 MED ORDER — LISINOPRIL 10 MG PO TABS: 20.0000 mg | ORAL_TABLET | Freq: Two times a day (BID) | ORAL | 0 refills | Status: DC

## 2017-08-18 MED ORDER — FENTANYL CITRATE (PF) 100 MCG/2ML IJ SOLN
INTRAMUSCULAR | Status: AC
  Filled 2017-08-18: qty 2

## 2017-08-18 MED ORDER — LIDOCAINE VISCOUS 2 % MT SOLN
OROMUCOSAL | Status: DC | PRN
  Administered 2017-08-18: 5 mL via OROMUCOSAL

## 2017-08-18 SURGICAL SUPPLY — 2 items
LOOP REVEAL LINQSYS (Prosthesis & Implant Heart) ×3 IMPLANT
PACK LOOP INSERTION (CUSTOM PROCEDURE TRAY) ×3 IMPLANT

## 2017-08-18 NOTE — H&P (View-Only) (Signed)
ELECTROPHYSIOLOGY CONSULT NOTE  Patient ID: Justin [name] MRN: [id number], DOB/AGE: September 08, 1961   Admit date: 08/18/2017 Date of Consult: 08/18/2017  Primary Physician: Angelina Sheriff, MD Primary Cardiologist: none Reason for Consultation: Cryptogenic stroke -; recommendations regarding Implantable Loop Recorder  History of Present Illness Tyree Fluharty was admitted on 08/11/17 with speech difficulties, r sided weakness, diagnosed with stroke. Neurology noted Acute patchy right cerebellar infarcts in SCA/AICA distribution of embolic etiology without identified source.  he  underwent workup for stroke including echocardiogram and carotid dopplers.  The patient was discharged to be arranged for out patient TEE and consideration/recommendation for loop by neurology notes.  He was originally seen at [facility] and transferred to [facility] at that time.  His PMHx was negative prior to the stroke, his wife states likely had HTN but refused to go to the doctor and get it treated.  Notes mention arrival SBP 230 range.  The patient denies any known cardiac history or any kind of cardiac symptoms prior to or since his stroke.   Echocardiogram 08/12/17 Study Conclusions - Left ventricle: The cavity size was normal. Wall thickness was   increased in a pattern of mild LVH. The estimated ejection   fraction was 55%. Wall motion was normal; there were no regional   wall motion abnormalities. Doppler parameters are consistent with   abnormal left ventricular relaxation (grade 1 diastolic   dysfunction). - Aortic valve: There was no stenosis. - Mitral valve: There was no significant regurgitation. - Right ventricle: Poorly visualized. The cavity size was normal.   Systolic function was normal. - Pulmonary arteries: No complete TR doppler jet so unable to   estimate PA systolic pressure. IVC not visualized. Impressions: - Technically difficult study with poor acoustic windows. Normal LV   size with  mild LV hypertrophy. EF 55%. Poorly visualized RV,   probably normal. No significant valvular abnormalities. Lab work is reviewed.   EP has been asked to evaluate for placement of an implantable loop recorder to monitor for atrial fibrillation at the request of Dr. Leonie Man     Past Medical History:  Diagnosis Date  . Hypertension   . Stroke [facility])      Surgical History: History reviewed. No pertinent surgical history.   Prescriptions Prior to Admission  Medication Sig Dispense Refill Last Dose  . acetaminophen (TYLENOL) 650 MG CR tablet Take 1,300 mg by mouth every 8 (eight) hours as needed for pain.   Past Week at Unknown time  . aspirin EC 81 MG EC tablet Take 1 tablet (81 mg total) by mouth daily. 180 tablet 1 08/18/2017 at Unknown time  . atorvastatin (LIPITOR) 80 MG tablet Take 1 tablet (80 mg total) by mouth daily at 6 PM. 30 tablet 5 08/17/2017 at Unknown time  . hydrochlorothiazide (HYDRODIURIL) 25 MG tablet Take 1 tablet (25 mg total) by mouth daily. 30 tablet 0 08/18/2017 at Unknown time  . lisinopril (PRINIVIL,ZESTRIL) 10 MG tablet Take 1 tablet (10 mg total) by mouth daily. 30 tablet 0 08/18/2017 at Unknown time    Inpatient Medications:   Allergies:  Allergies  Allergen Reactions  . Ciprofloxacin Anaphylaxis and Swelling  . Penicillins Anaphylaxis and Swelling    Has patient had a PCN reaction causing immediate rash, facial/tongue/throat swelling, SOB or lightheadedness with hypotension: Yes Has patient had a PCN reaction causing severe rash involving mucus membranes or skin necrosis: Yes Has patient had a PCN reaction that required hospitalization: Yes Has patient had a  PCN reaction occurring within the last 10 years: Yes If all of the above answers are "NO", then may proceed with Cephalosporin use.     Social History   Social History  . Marital status: Married    Spouse name: N/A  . Number of children: N/A  . Years of education: N/A   Occupational History  .  Not on file.   Social History Main Topics  . Smoking status: Never Smoker  . Smokeless tobacco: Never Used  . Alcohol use Not on file  . Drug use: Unknown  . Sexual activity: Not on file   Other Topics Concern  . Not on file   Social History Narrative  . No narrative on file     Family History  Problem Relation Age of Onset  . Stroke Mother   . Hypertension Mother   . Stroke Sister   . Hypertension Sister       Review of Systems: All other systems reviewed and are otherwise negative except as noted above.  Physical Exam: Vitals:   08/18/17 0920 08/18/17 0925 08/18/17 0930 08/18/17 0935  BP: (!) 251/103 (!) 196/103 (!) 226/120 (!) 218/124  Pulse: 70 63 65 64  Resp: 12 13 14 14   TempSrc:      SpO2: 100% 97% 100% 100%  Weight:      Height:        GEN- The patient is well appearing, alert and oriented x 3 today.   Head- normocephalic, atraumatic Eyes-  Sclera clear, conjunctiva pink Ears- hearing intact Oropharynx- clear Neck- supple Lungs- Clear to ausculation bilaterally, normal work of breathing Heart- Regular rate and rhythm, no murmurs, rubs or gallops  GI- soft, NT, ND Extremities- no clubbing, cyanosis, or edema MS- no significant deformity or atrophy Skin- no rash or lesion Psych- euthymic mood, full affect   Labs:   Lab Results  Component Value Date   WBC 10.5 08/16/2017   HGB 16.6 08/16/2017   HCT 48.8 08/16/2017   MCV 92.1 08/16/2017   PLT 213 08/16/2017     Recent Labs Lab 08/16/17 0821  NA 141  K 4.0  CL 105  CO2 28  BUN 12  CREATININE 1.00  CALCIUM 9.4  PROT 7.5  BILITOT 1.3*  ALKPHOS 85  ALT 70*  AST 41  GLUCOSE 97   No results found for: CKTOTAL, CKMB, CKMBINDEX, TROPONINI Lab Results  Component Value Date   CHOL 185 08/12/2017   Lab Results  Component Value Date   HDL 41 08/12/2017   Lab Results  Component Value Date   LDLCALC 131 (H) 08/12/2017   Lab Results  Component Value Date   TRIG 63 08/12/2017    Lab Results  Component Value Date   CHOLHDL 4.5 08/12/2017   No results found for: LDLDIRECT  No results found for: DDIMER   Radiology/Studies:  Mr Brain Wo Contrast Result Date: 08/12/2017 CLINICAL DATA:  56 year old male with sudden onset right upper and lower extremity weakness. Headache and word finding difficulty. Status post IV tPA. EXAM: MRI HEAD WITHOUT CONTRAST MRA HEAD WITHOUT CONTRAST TECHNIQUE: Multiplanar, multiecho pulse sequences of the brain and surrounding structures were obtained without intravenous contrast. Angiographic images of the head were obtained using MRA technique without contrast. COMPARISON:  None. [facility] noncontrast CT head and CTA head and neck 08/11/2017. FINDINGS: MRI HEAD FINDINGS Brain: Patchy 4 cm area of restricted diffusion scattered in the right superior and mid cerebellar hemisphere. Associated T2 and FLAIR hyperintensity, and T1  hypointensity with minimal petechial hemorrhage (series 10, image 25). No associated mass effect. No other restricted diffusion. No midline shift, mass effect, evidence of mass lesion, ventriculomegaly, extra-axial collection or malignant intracranial hemorrhage. Cervicomedullary junction and pituitary are within normal limits. Scattered and occasionally patchy bilateral cerebral white matter T2 and FLAIR hyperintensity is in a nonspecific configuration. No cortical encephalomalacia or other cerebral blood products are identified. The bilateral deep gray matter nuclei, brainstem, and left cerebellar hemisphere appear normal. Vascular: Major intracranial vascular flow voids are preserved. Skull and upper cervical spine: Negative. Normal bone marrow signal. Sinuses/Orbits: Normal orbits soft tissues. Visualized paranasal sinuses and mastoids are stable and well pneumatized. Other: Visible internal auditory structures appear normal. Scalp and face soft tissues appear negative. MRA HEAD FINDINGS The distal vertebral arteries are  not included on these images but were patent on the CTA yesterday. The right appeared mildly dominant. Patent vertebrobasilar junction. The basilar artery is irregular suggesting atherosclerosis, but patent without stenosis. Possible decreased flow in the right AICA origin (series 4 image 156). Left AICA appears patent. Both SCA is are patent (the left side is duplicated). There are fetal type bilateral PCA origins. There is mild left PCA P2 segment irregularity and stenosis, but otherwise the bilateral PCA branches appear normal. Antegrade flow in both ICA siphons. No siphon stenosis. Normal ophthalmic and posterior communicating artery origins. Normal carotid termini, MCA and ACA origins. Anterior communicating artery is diminutive. Visible ACA branches are normal. Visible bilateral MCA branches are normal, the left MCA has an early bifurcation. IMPRESSION: 1. Patchy acute infarcts in the right cerebellum, right SCA/AICA territory. 2. Trace petechial hemorrhage (Heidelberg Classification 1a) and mild cerebellar edema without mass effect. 3. Evidence of Basilar Artery atherosclerosis, and decreased flow at the Right AICA origin. No significant basilar stenosis. 4. Negative anterior circulation. Fetal type bilateral PCA origins with mild left P2 segment irregularity and stenosis. 5. Moderate for age nonspecific cerebral white matter signal changes, favor small vessel disease related. Electronically Signed   By: Genevie Ann M.D.   On: 08/12/2017 10:39   Dg Chest Port 1 View Result Date: 08/11/2017 CLINICAL DATA:  Right-sided numbness with slurred speech EXAM: PORTABLE CHEST 1 VIEW COMPARISON:  None. FINDINGS: No acute consolidation or effusion. Borderline to mild cardiomegaly. Negative for pneumothorax. IMPRESSION: Borderline to mild cardiomegaly.  Negative for edema or infiltrate. Electronically Signed   By: Donavan Foil M.D.   On: 08/11/2017 21:20    12-lead ECG I do not see any EKGs done in Epic, reviewed media  scanned documents without EKGs  Telemetry: SR here, CV strips in Epic reviewed, no save strips to review  Assessment and Plan:  1. Cryptogenic stroke The patient presents today for TEE and consideration for loop with hx of cryptogenic stroke.  The patient has a TEE planned for this AM.  I spoke at length with the patient about monitoring for afib with either a 30 day event monitor or an implantable loop recorder.  Risks, benefits, and alteratives to implantable loop recorder were discussed with the patient today.   At this time, the patient is very clear in their decision to proceed with implantable loop recorder.   Wound care was reviewed with the patient (keep incision clean and dry for 3 days).  Wound check will be scheduled for the patient.  Please call with questions.   Renee Dyane Dustman, PA-C 08/18/2017  I have seen, examined the patient, and reviewed the above assessment and plan.  Changes to  above are made where necessary.  On exam, RRR.  Risks and benefits to long term monitoring were discussed with the patient who wishes to proceed.  TEE is reviewed with patient.  Co Sign: [name], MD 08/18/2017 10:15 AM

## 2017-08-18 NOTE — CV Procedure (Signed)
TRANSESOPHAGEAL ECHOCARDIOGRAM (TEE) NOTE  INDICATIONS: cryptogenic stroke  PROCEDURE:   Informed consent was obtained prior to the procedure. The risks, benefits and alternatives for the procedure were discussed and the patient comprehended these risks.  Risks include, but are not limited to, cough, sore throat, vomiting, nausea, somnolence, esophageal and stomach trauma or perforation, bleeding, low blood pressure, aspiration, pneumonia, infection, trauma to the teeth and death.    After a procedural time-out, the patient was given 5 mg versed and 75 mcg fentanyl for moderate sedation.  The patient's heart rate, blood pressure, and oxygen saturation are monitored continuously during the procedure.The oropharynx was anesthetized 10 cc of topical 1% viscous lidocaine.  The transesophageal probe was inserted in the esophagus and stomach without difficulty and multiple views were obtained.  The patient was kept under observation until the patient left the procedure room.  The period of conscious sedation is 21 minutes, of which I was present face-to-face 100% of this time. The patient left the procedure room in stable condition.   Agitated microbubble saline contrast was administered.  COMPLICATIONS:    There were no immediate complications.  Findings:  1. LEFT VENTRICLE: The left ventricular wall thickness is mildly increased.  The left ventricular cavity is normal in size. Wall motion is normal.  LVEF is 55-60%.  2. RIGHT VENTRICLE:  The right ventricle is normal in structure and function without any thrombus or masses.    3. LEFT ATRIUM:  The left atrium is mildly dilated in size without any thrombus or masses.  There is not spontaneous echo contrast ("smoke") in the left atrium consistent with a low flow state.  4. LEFT ATRIAL APPENDAGE:  The left atrial appendage is free of any thrombus or masses. The appendage has single lobes. Pulse doppler indicates high flow in the  appendage.  5. ATRIAL SEPTUM:  The atrial septum appears intact and is free of thrombus and/or masses.  There is no evidence for interatrial shunting by color doppler and saline microbubble.  6. RIGHT ATRIUM:  The right atrium is normal in size and function without any thrombus or masses.  7. MITRAL VALVE:  The mitral valve is normal in structure and function with no regurgitation.  There were no vegetations or stenosis.  8. AORTIC VALVE:  The aortic valve is trileaflet, normal in structure and function with no regurgitation.  There were no vegetations or stenosis  9. TRICUSPID VALVE:  The tricuspid valve is normal in structure and function with trivial regurgitation.  There were no vegetations or stenosis  10.  PULMONIC VALVE:  The pulmonic valve is normal in structure and function with trivial regurgitation.  There were no vegetations or stenosis.   11. AORTIC ARCH, ASCENDING AND DESCENDING AORTA:  There aorta was not well-visualized.  12. PULMONARY VEINS: Anomalous pulmonary venous return was not noted.  13. PERICARDIUM: The pericardium appeared normal and non-thickened.  There is no pericardial effusion.  IMPRESSION:   1. No LAA thrombus 2. Negative for PFO 3. Mild LVH 4. LVEF 55-60%  RECOMMENDATIONS:    1. Recommend proceeding with ILR for further work-up of crpytogenic stroke. 2. Poorly controlled hypertension. Will give additional 10 mg lisinopril today and increase to 20 mg lisinopril BID. Patient has a PCP appointment tomorrow. 3. History of snoring and witnessed apnea - highly recommend outpatient sleep study.  Time Spent Directly with the Patient:  45 minutes   Pixie Casino, MD, [place]  Attending Cardiologist  Direct Dial: [phone]  Fax: [phone]  Website:  [email or web address]  Nadean Corwin Vesna Kable 08/18/2017, 10:04 AM

## 2017-08-18 NOTE — H&P (Signed)
    INTERVAL PROCEDURE H&P  History and Physical Interval Note:  08/18/2017 8:10 AM  Justin [name] has presented today for their planned procedure. The various methods of treatment have been discussed with the patient and family. After consideration of risks, benefits and other options for treatment, the patient has consented to the procedure.  The patients' outpatient history has been reviewed, patient examined, and no change in status from most recent office note within the past 30 days. I have reviewed the patients' chart and labs and will proceed as planned. Questions were answered to the patient's satisfaction.   Pixie Casino, MD, [place]  Attending Cardiologist  Direct Dial: [phone]  Fax: [phone]  Website:  [email or web address]  Justin [name] 08/18/2017, 8:10 AM

## 2017-08-18 NOTE — Discharge Instructions (Signed)
TAKE AN ADDITIONAL 10 mg (ONE TABLET) OF LISINOPRIL WHEN YOU GET HOME AND THEN 2 TABLETS (20 MG) BEFORE BEDTIME FOR ADDITIONAL BLOOD PRESSURE CONTROL.  TEE  YOU HAD AN CARDIAC PROCEDURE TODAY: Refer to the procedure report and other information in the discharge instructions given to you for any specific questions about what was found during the examination. If this information does not answer your questions, please call [facility] office at [phone] to clarify.   DIET: Your first meal following the procedure should be a light meal and then it is ok to progress to your normal diet. A half-sandwich or bowl of soup is an example of a good first meal. Heavy or fried foods are harder to digest and may make you feel nauseous or bloated. Drink plenty of fluids but you should avoid alcoholic beverages for 24 hours. If you had a esophageal dilation, please see attached instructions for diet.   ACTIVITY: Your care partner should take you home directly after the procedure. You should plan to take it easy, moving slowly for the rest of the day. You can resume normal activity the day after the procedure however YOU SHOULD NOT DRIVE, use power tools, machinery or perform tasks that involve climbing or major physical exertion for 24 hours (because of the sedation medicines used during the test).   SYMPTOMS TO REPORT IMMEDIATELY: A cardiologist can be reached at any hour. Please call [phone] for any of the following symptoms:  Vomiting of blood or coffee ground material  New, significant abdominal pain  New, significant chest pain or pain under the shoulder blades  Painful or persistently difficult swallowing  New shortness of breath  Black, tarry-looking or red, bloody stools  FOLLOW UP:  Please also call with any specific questions about appointments or follow up tests.

## 2017-08-18 NOTE — Consult Note (Signed)
ELECTROPHYSIOLOGY CONSULT NOTE  Patient ID: Justin [name] MRN: [id number], DOB/AGE: August 16, 1961   Admit date: 08/18/2017 Date of Consult: 08/18/2017  Primary Physician: Angelina Sheriff, MD Primary Cardiologist: none Reason for Consultation: Cryptogenic stroke -; recommendations regarding Implantable Loop Recorder  History of Present Illness Justin [name] was admitted on 08/11/17 with speech difficulties, r sided weakness, diagnosed with stroke. Neurology noted Acute patchy right cerebellar infarcts in SCA/AICA distribution of embolic etiology without identified source.  he  underwent workup for stroke including echocardiogram and carotid dopplers.  The patient was discharged to be arranged for out patient TEE and consideration/recommendation for loop by neurology notes.  He was originally seen at [facility] and transferred to [facility] at that time.  His PMHx was negative prior to the stroke, his wife states likely had HTN but refused to go to the doctor and get it treated.  Notes mention arrival SBP 230 range.  The patient denies any known cardiac history or any kind of cardiac symptoms prior to or since his stroke.   Echocardiogram 08/12/17 Study Conclusions - Left ventricle: The cavity size was normal. Wall thickness was   increased in a pattern of mild LVH. The estimated ejection   fraction was 55%. Wall motion was normal; there were no regional   wall motion abnormalities. Doppler parameters are consistent with   abnormal left ventricular relaxation (grade 1 diastolic   dysfunction). - Aortic valve: There was no stenosis. - Mitral valve: There was no significant regurgitation. - Right ventricle: Poorly visualized. The cavity size was normal.   Systolic function was normal. - Pulmonary arteries: No complete TR doppler jet so unable to   estimate PA systolic pressure. IVC not visualized. Impressions: - Technically difficult study with poor acoustic windows. Normal LV   size with  mild LV hypertrophy. EF 55%. Poorly visualized RV,   probably normal. No significant valvular abnormalities. Lab work is reviewed.   EP has been asked to evaluate for placement of an implantable loop recorder to monitor for atrial fibrillation at the request of Dr. Leonie Man     Past Medical History:  Diagnosis Date  . Hypertension   . Stroke Justin [name])      Surgical History: History reviewed. No pertinent surgical history.   Prescriptions Prior to Admission  Medication Sig Dispense Refill Last Dose  . acetaminophen (TYLENOL) 650 MG CR tablet Take 1,300 mg by mouth every 8 (eight) hours as needed for pain.   Past Week at Unknown time  . aspirin EC 81 MG EC tablet Take 1 tablet (81 mg total) by mouth daily. 180 tablet 1 08/18/2017 at Unknown time  . atorvastatin (LIPITOR) 80 MG tablet Take 1 tablet (80 mg total) by mouth daily at 6 PM. 30 tablet 5 08/17/2017 at Unknown time  . hydrochlorothiazide (HYDRODIURIL) 25 MG tablet Take 1 tablet (25 mg total) by mouth daily. 30 tablet 0 08/18/2017 at Unknown time  . lisinopril (PRINIVIL,ZESTRIL) 10 MG tablet Take 1 tablet (10 mg total) by mouth daily. 30 tablet 0 08/18/2017 at Unknown time    Inpatient Medications:   Allergies:  Allergies  Allergen Reactions  . Ciprofloxacin Anaphylaxis and Swelling  . Penicillins Anaphylaxis and Swelling    Has patient had a PCN reaction causing immediate rash, facial/tongue/throat swelling, SOB or lightheadedness with hypotension: Yes Has patient had a PCN reaction causing severe rash involving mucus membranes or skin necrosis: Yes Has patient had a PCN reaction that required hospitalization: Yes Has patient had a  PCN reaction occurring within the last 10 years: Yes If all of the above answers are "NO", then may proceed with Cephalosporin use.     Social History   Social History  . Marital status: Married    Spouse name: N/A  . Number of children: N/A  . Years of education: N/A   Occupational History  .  Not on file.   Social History Main Topics  . Smoking status: Never Smoker  . Smokeless tobacco: Never Used  . Alcohol use Not on file  . Drug use: Unknown  . Sexual activity: Not on file   Other Topics Concern  . Not on file   Social History Narrative  . No narrative on file     Family History  Problem Relation Age of Onset  . Stroke Mother   . Hypertension Mother   . Stroke Sister   . Hypertension Sister       Review of Systems: All other systems reviewed and are otherwise negative except as noted above.  Physical Exam: Vitals:   08/18/17 0920 08/18/17 0925 08/18/17 0930 08/18/17 0935  BP: (!) 251/103 (!) 196/103 (!) 226/120 (!) 218/124  Pulse: 70 63 65 64  Resp: 12 13 14 14   TempSrc:      SpO2: 100% 97% 100% 100%  Weight:      Height:        GEN- The patient is well appearing, alert and oriented x 3 today.   Head- normocephalic, atraumatic Eyes-  Sclera clear, conjunctiva pink Ears- hearing intact Oropharynx- clear Neck- supple Lungs- Clear to ausculation bilaterally, normal work of breathing Heart- Regular rate and rhythm, no murmurs, rubs or gallops  GI- soft, NT, ND Extremities- no clubbing, cyanosis, or edema MS- no significant deformity or atrophy Skin- no rash or lesion Psych- euthymic mood, full affect   Labs:   Lab Results  Component Value Date   WBC 10.5 08/16/2017   HGB 16.6 08/16/2017   HCT 48.8 08/16/2017   MCV 92.1 08/16/2017   PLT 213 08/16/2017     Recent Labs Lab 08/16/17 0821  NA 141  K 4.0  CL 105  CO2 28  BUN 12  CREATININE 1.00  CALCIUM 9.4  PROT 7.5  BILITOT 1.3*  ALKPHOS 85  ALT 70*  AST 41  GLUCOSE 97   No results found for: CKTOTAL, CKMB, CKMBINDEX, TROPONINI Lab Results  Component Value Date   CHOL 185 08/12/2017   Lab Results  Component Value Date   HDL 41 08/12/2017   Lab Results  Component Value Date   LDLCALC 131 (H) 08/12/2017   Lab Results  Component Value Date   TRIG 63 08/12/2017    Lab Results  Component Value Date   CHOLHDL 4.5 08/12/2017   No results found for: LDLDIRECT  No results found for: DDIMER   Radiology/Studies:  Mr Brain Wo Contrast Result Date: 08/12/2017 CLINICAL DATA:  56 year old male with sudden onset right upper and lower extremity weakness. Headache and word finding difficulty. Status post IV tPA. EXAM: MRI HEAD WITHOUT CONTRAST MRA HEAD WITHOUT CONTRAST TECHNIQUE: Multiplanar, multiecho pulse sequences of the brain and surrounding structures were obtained without intravenous contrast. Angiographic images of the head were obtained using MRA technique without contrast. COMPARISON:  None. [name] noncontrast CT head and CTA head and neck 08/11/2017. FINDINGS: MRI HEAD FINDINGS Brain: Patchy 4 cm area of restricted diffusion scattered in the right superior and mid cerebellar hemisphere. Associated T2 and FLAIR hyperintensity, and T1  hypointensity with minimal petechial hemorrhage (series 10, image 25). No associated mass effect. No other restricted diffusion. No midline shift, mass effect, evidence of mass lesion, ventriculomegaly, extra-axial collection or malignant intracranial hemorrhage. Cervicomedullary junction and pituitary are within normal limits. Scattered and occasionally patchy bilateral cerebral white matter T2 and FLAIR hyperintensity is in a nonspecific configuration. No cortical encephalomalacia or other cerebral blood products are identified. The bilateral deep gray matter nuclei, brainstem, and left cerebellar hemisphere appear normal. Vascular: Major intracranial vascular flow voids are preserved. Skull and upper cervical spine: Negative. Normal bone marrow signal. Sinuses/Orbits: Normal orbits soft tissues. Visualized paranasal sinuses and mastoids are stable and well pneumatized. Other: Visible internal auditory structures appear normal. Scalp and face soft tissues appear negative. MRA HEAD FINDINGS The distal vertebral arteries are  not included on these images but were patent on the CTA yesterday. The right appeared mildly dominant. Patent vertebrobasilar junction. The basilar artery is irregular suggesting atherosclerosis, but patent without stenosis. Possible decreased flow in the right AICA origin (series 4 image 156). Left AICA appears patent. Both SCA is are patent (the left side is duplicated). There are fetal type bilateral PCA origins. There is mild left PCA P2 segment irregularity and stenosis, but otherwise the bilateral PCA branches appear normal. Antegrade flow in both ICA siphons. No siphon stenosis. Normal ophthalmic and posterior communicating artery origins. Normal carotid termini, MCA and ACA origins. Anterior communicating artery is diminutive. Visible ACA branches are normal. Visible bilateral MCA branches are normal, the left MCA has an early bifurcation. IMPRESSION: 1. Patchy acute infarcts in the right cerebellum, right SCA/AICA territory. 2. Trace petechial hemorrhage (Heidelberg Classification 1a) and mild cerebellar edema without mass effect. 3. Evidence of Basilar Artery atherosclerosis, and decreased flow at the Right AICA origin. No significant basilar stenosis. 4. Negative anterior circulation. Fetal type bilateral PCA origins with mild left P2 segment irregularity and stenosis. 5. Moderate for age nonspecific cerebral white matter signal changes, favor small vessel disease related. Electronically Signed   By: Genevie Ann M.D.   On: 08/12/2017 10:39   Dg Chest Port 1 View Result Date: 08/11/2017 CLINICAL DATA:  Right-sided numbness with slurred speech EXAM: PORTABLE CHEST 1 VIEW COMPARISON:  None. FINDINGS: No acute consolidation or effusion. Borderline to mild cardiomegaly. Negative for pneumothorax. IMPRESSION: Borderline to mild cardiomegaly.  Negative for edema or infiltrate. Electronically Signed   By: Donavan Foil M.D.   On: 08/11/2017 21:20    12-lead ECG I do not see any EKGs done in Epic, reviewed media  scanned documents without EKGs  Telemetry: SR here, CV strips in Epic reviewed, no save strips to review  Assessment and Plan:  1. Cryptogenic stroke The patient presents today for TEE and consideration for loop with hx of cryptogenic stroke.  The patient has a TEE planned for this AM.  I spoke at length with the patient about monitoring for afib with either a 30 day event monitor or an implantable loop recorder.  Risks, benefits, and alteratives to implantable loop recorder were discussed with the patient today.   At this time, the patient is very clear in their decision to proceed with implantable loop recorder.   Wound care was reviewed with the patient (keep incision clean and dry for 3 days).  Wound check will be scheduled for the patient.  Please call with questions.   Renee Dyane Dustman, PA-C 08/18/2017  I have seen, examined the patient, and reviewed the above assessment and plan.  Changes to  above are made where necessary.  On exam, RRR.  Risks and benefits to long term monitoring were discussed with the patient who wishes to proceed.  TEE is reviewed with patient.  Co Sign: [name], MD 08/18/2017 10:15 AM

## 2017-08-18 NOTE — Interval H&P Note (Signed)
History and Physical Interval Note:  08/18/2017 10:16 AM  Justin [name]  has presented today for surgery, with the diagnosis of afib  The various methods of treatment have been discussed with the patient and family. After consideration of risks, benefits and other options for treatment, the patient has consented to  Procedure(s): LOOP RECORDER INSERTION (N/A) as a surgical intervention .  The patient's history has been reviewed, patient examined, no change in status, stable for surgery.  I have reviewed the patient's chart and labs.  Questions were answered to the patient's satisfaction.     [name]

## 2017-08-19 ENCOUNTER — Encounter (HOSPITAL_COMMUNITY): Payer: Self-pay | Admitting: Internal Medicine

## 2017-08-19 DIAGNOSIS — I10 Essential (primary) hypertension: Secondary | ICD-10-CM | POA: Diagnosis not present

## 2017-08-21 DIAGNOSIS — I10 Essential (primary) hypertension: Secondary | ICD-10-CM | POA: Diagnosis not present

## 2017-08-26 DIAGNOSIS — G4733 Obstructive sleep apnea (adult) (pediatric): Secondary | ICD-10-CM | POA: Diagnosis not present

## 2017-08-31 ENCOUNTER — Ambulatory Visit (INDEPENDENT_AMBULATORY_CARE_PROVIDER_SITE_OTHER): Payer: Self-pay | Admitting: *Deleted

## 2017-08-31 DIAGNOSIS — I639 Cerebral infarction, unspecified: Secondary | ICD-10-CM

## 2017-09-01 DIAGNOSIS — R899 Unspecified abnormal finding in specimens from other organs, systems and tissues: Secondary | ICD-10-CM | POA: Diagnosis not present

## 2017-09-01 LAB — CUP PACEART INCLINIC DEVICE CHECK
Implantable Pulse Generator Implant Date: 20181009
MDC IDC SESS DTM: 20181022203819

## 2017-09-01 NOTE — Progress Notes (Signed)
Wound check appointment. Steri-strips removed. Wound without redness or edema. Incision edges approximated, wound well healed. Loop check in clinic. Battery status: good. R-waves 0.67mV. 0 symptom episodes, 0 tachy episodes, 0 pause episodes, 0 brady episodes. 0 AF episodes. Monthly summary reports and ROV with JA PRN

## 2017-09-10 DIAGNOSIS — I10 Essential (primary) hypertension: Secondary | ICD-10-CM | POA: Diagnosis not present

## 2017-09-10 DIAGNOSIS — Z23 Encounter for immunization: Secondary | ICD-10-CM | POA: Diagnosis not present

## 2017-09-17 ENCOUNTER — Ambulatory Visit (INDEPENDENT_AMBULATORY_CARE_PROVIDER_SITE_OTHER): Payer: 59 | Admitting: *Deleted

## 2017-09-17 ENCOUNTER — Ambulatory Visit: Payer: 59 | Admitting: Neurology

## 2017-09-17 ENCOUNTER — Encounter: Payer: Self-pay | Admitting: Neurology

## 2017-09-17 VITALS — BP 118/72 | HR 58 | Ht 73.0 in | Wt 288.6 lb

## 2017-09-17 DIAGNOSIS — I639 Cerebral infarction, unspecified: Secondary | ICD-10-CM

## 2017-09-17 NOTE — Patient Instructions (Signed)
I had a long d/w patient and his wife about his recent  Cryptogenic stroke, risk for recurrent stroke/TIAs, personally independently reviewed imaging studies and stroke evaluation results and answered questions.Continue aspirin 81 mg daily  for secondary stroke prevention and maintain strict control of hypertension with blood pressure goal below 130/90, diabetes with hemoglobin A1c goal below 6.5% and lipids with LDL cholesterol goal below 70 mg/dL. I also advised the patient to eat a healthy diet with plenty of whole grains, cereals, fruits and vegetables, exercise regularly and maintain ideal body weight .I also counseled him to start using his CPAP for his sleep apnea He may consider possible participation in the [facility] stroke study and will be given information to review and decide.  Followup in the future with my nurse practitioner in 3 months or call earlier if necessary  Stroke Prevention Some medical conditions and behaviors are associated with an increased chance of having a stroke. You may prevent a stroke by making healthy choices and managing medical conditions. How can I reduce my risk of having a stroke?  Stay physically active. Get at least 30 minutes of activity on most or all days.  Do not smoke. It may also be helpful to avoid exposure to secondhand smoke.  Limit alcohol use. Moderate alcohol use is considered to be: ? No more than 2 drinks per day for men. ? No more than 1 drink per day for nonpregnant women.  Eat healthy foods. This involves: ? Eating 5 or more servings of fruits and vegetables a day. ? Making dietary changes that address high blood pressure (hypertension), high cholesterol, diabetes, or obesity.  Manage your cholesterol levels. ? Making food choices that are high in fiber and low in saturated fat, trans fat, and cholesterol may control cholesterol levels. ? Take any prescribed medicines to control cholesterol as directed by your health care  provider.  Manage your diabetes. ? Controlling your carbohydrate and sugar intake is recommended to manage diabetes. ? Take any prescribed medicines to control diabetes as directed by your health care provider.  Control your hypertension. ? Making food choices that are low in salt (sodium), saturated fat, trans fat, and cholesterol is recommended to manage hypertension. ? Ask your health care provider if you need treatment to lower your blood pressure. Take any prescribed medicines to control hypertension as directed by your health care provider. ? If you are 38-36 years of age, have your blood pressure checked every 3-5 years. If you are 57 years of age or older, have your blood pressure checked every year.  Maintain a healthy weight. ? Reducing calorie intake and making food choices that are low in sodium, saturated fat, trans fat, and cholesterol are recommended to manage weight.  Stop drug abuse.  Avoid taking birth control pills. ? Talk to your health care provider about the risks of taking birth control pills if you are over 63 years old, smoke, get migraines, or have ever had a blood clot.  Get evaluated for sleep disorders (sleep apnea). ? Talk to your health care provider about getting a sleep evaluation if you snore a lot or have excessive sleepiness.  Take medicines only as directed by your health care provider. ? For some people, aspirin or blood thinners (anticoagulants) are helpful in reducing the risk of forming abnormal blood clots that can lead to stroke. If you have the irregular heart rhythm of atrial fibrillation, you should be on a blood thinner unless there is a good reason you  cannot take them. ? Understand all your medicine instructions.  Make sure that other conditions (such as anemia or atherosclerosis) are addressed. Get help right away if:  You have sudden weakness or numbness of the face, arm, or leg, especially on one side of the body.  Your face or eyelid  droops to one side.  You have sudden confusion.  You have trouble speaking (aphasia) or understanding.  You have sudden trouble seeing in one or both eyes.  You have sudden trouble walking.  You have dizziness.  You have a loss of balance or coordination.  You have a sudden, severe headache with no known cause.  You have new chest pain or an irregular heartbeat. Any of these symptoms may represent a serious problem that is an emergency. Do not wait to see if the symptoms will go away. Get medical help at once. Call your local emergency services (911 in [place] Do not drive yourself to the hospital. This information is not intended to replace advice given to you by your health care provider. Make sure you discuss any questions you have with your health care provider. Document Released: 12/04/2004 Document Revised: 04/03/2016 Document Reviewed: 04/29/2013 Elsevier Interactive Patient Education  2017 [employer].

## 2017-09-17 NOTE — Progress Notes (Signed)
[facility] [address] [place]. [place] [id number] [phone]       OFFICE FOLLOW-UP NOTE  Justin. Justin [name] Date of Birth:  1961/05/06 Medical Record Number:  [id number]   HPI: Justin [name] is a 50 year Caucasian male seen today for first office follow-up visit following hospital admission for stroke in October 2018.  He is accompanied by his wife.  History is obtained from them as well as review of electronic medical records.  I have personally reviewed imaging films.Justin [name] a 56 y.o.malewith no past medical history who presented to [facility] with sudden onset of right arm and leg weakness. He reported to the emergency room doctors there that he was working and noticed that he could not move the right hand. He had to focus hard to make his right arm movement. When it started becoming more difficult, that is when he decided to come to the hospital. Also had word finding difficulty. Also complains of headache. He was evaluated by telemedicine neurology or at [facility] ER, and they suspected a stroke. Decision was made to use IV TPA. His systolic blood pressure on presentation was in the 230s for the phone call report. I do not have the telemedicine neurology note to review.TPA bolus was administered, but because of inability to control his blood pressures, decision was made by the team over and round TO not pursue the infusion of TPA. He was transferred to [facility] for higher level of care as they do not have neurology coverage over at [facility].  He was admitted to the neuro ICU for monitoring after partial dose of TPA administered at [facility].  His blood pressure was tightly controlled.  MRI scan of the brain showed small right superior cerebellar embolic stroke.  MRA showed no large vessel intracranial stenosis.  Carotid ultrasound showed no significant extracranial stenosis.  LDL cholesterol was elevated at 131 mg percent.  Hemoglobin A1c was 5.2.  Transthoracic echo  showed normal ejection fraction.  Patient subsequently had outpatient TEE which showed no cardiac source of embolism and had loop recorder inserted by Dr. Rayann Heman on 08/31/17 and so far paroxysmal A. fib has not yet been found.  Patient was discharged home and has finished physical therapy.  He states he occasionally has some slurred speech and incoordination particularly when he is tired or tries to walk quickly.  He also complains of mild headaches and some light sensitivity.  He states his blood pressure is well controlled and today it is 1 1 8/72.  He is tolerating Lipitor with only minor muscle aches.  Is tolerating aspirin without bruising or bleeding.  He has been diagnosed with sleep apnea and is awaiting arrival of his CPAP mask and plans to use it regularly.  He also knows  that he has to go on a diet and lose weight and be more active.   ROS:   14 system review of systems is positive for headache, light sensitivity, weakness, joint pain, aching muscles, decreased concentration, sleep apnea, frequent waking, daytime sleepiness, snoring, sleep talking and all other systems negative  PMH:  Past Medical History:  Diagnosis Date  . Hypertension   . Stroke [facility])     Social History:  Social History   Socioeconomic History  . Marital status: Married    Spouse name: Not on file  . Number of children: Not on file  . Years of education: Not on file  . Highest education level: Not on file  Social Needs  .  Financial resource strain: Not on file  . Food insecurity - worry: Not on file  . Food insecurity - inability: Not on file  . Transportation needs - medical: Not on file  . Transportation needs - non-medical: Not on file  Occupational History  . Not on file  Tobacco Use  . Smoking status: Never Smoker  . Smokeless tobacco: Never Used  Substance and Sexual Activity  . Alcohol use: No    Frequency: Never  . Drug use: No  . Sexual activity: Not on file  Other Topics Concern  . Not  on file  Social History Narrative  . Not on file    Medications:   Current Outpatient Medications on File Prior to Visit  Medication Sig Dispense Refill  . acetaminophen (TYLENOL) 650 MG CR tablet Take 1,300 mg by mouth every 8 (eight) hours as needed for pain.    Marland Kitchen amLODipine (NORVASC) 5 MG tablet Take 5 mg by mouth daily.    Marland Kitchen aspirin EC 81 MG EC tablet Take 1 tablet (81 mg total) by mouth daily. 180 tablet 1  . atorvastatin (LIPITOR) 80 MG tablet Take 1 tablet (80 mg total) by mouth daily at 6 PM. 30 tablet 5  . hydrochlorothiazide (HYDRODIURIL) 25 MG tablet Take 1 tablet (25 mg total) by mouth daily. 30 tablet 0  . lisinopril (PRINIVIL,ZESTRIL) 10 MG tablet Take 2 tablets (20 mg total) by mouth 2 (two) times daily. 30 tablet 0   No current facility-administered medications on file prior to visit.     Allergies:   Allergies  Allergen Reactions  . Ciprofloxacin Anaphylaxis and Swelling  . Penicillins Anaphylaxis and Swelling    Has patient had a PCN reaction causing immediate rash, facial/tongue/throat swelling, SOB or lightheadedness with hypotension: Yes Has patient had a PCN reaction causing severe rash involving mucus membranes or skin necrosis: Yes Has patient had a PCN reaction that required hospitalization: Yes Has patient had a PCN reaction occurring within the last 10 years: Yes If all of the above answers are "NO", then may proceed with Cephalosporin use.     Physical Exam General: Obese middle-aged Caucasian male, seated, in no evident distress Head: head normocephalic and atraumatic.  Neck: supple with no carotid or supraclavicular bruits Cardiovascular: regular rate and rhythm, no murmurs Musculoskeletal: no deformity Skin:  no rash/petichiae Vascular:  Normal pulses all extremities Vitals:   09/17/17 0822  BP: 118/72  Pulse: (!) 58   Neurologic Exam Mental Status: Awake and fully alert. Oriented to place and time. Recent and remote memory intact. Attention  span, concentration and fund of knowledge appropriate. Mood and affect appropriate.  Cranial Nerves: Fundoscopic exam reveals sharp disc margins. Pupils equal, briskly reactive to light. Extraocular movements full without nystagmus. Visual fields full to confrontation. Hearing intact. Facial sensation intact. Face, tongue, palate moves normally and symmetrically.  Motor: Normal bulk and tone. Normal strength in all tested extremity muscles. Sensory.: intact to touch ,pinprick .position and vibratory sensation.  Coordination: Rapid alternating movements normal in all extremities. Finger-to-nose and heel-to-shin performed accurately bilaterally. Gait and Station: Arises from chair without difficulty. Stance is normal. Gait demonstrates normal stride length and balance . Able to heel, toe and tandem walk with minimal  difficulty.  Reflexes: 1+ and symmetric. Toes downgoing.   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  Vascular risk factors of hypertension, hyperlipidemia, sleep apnea  and obesity    PLAN: I had a long d/w patient and his wife about his recent  Cryptogenic stroke, risk for recurrent stroke/TIAs, personally independently reviewed imaging studies and stroke evaluation results and answered questions.Continue aspirin 81 mg daily  for secondary stroke prevention and maintain strict control of hypertension with blood pressure goal below 130/90, diabetes with hemoglobin A1c goal below 6.5% and lipids with LDL cholesterol goal below 70 mg/dL. I also advised the patient to eat a healthy diet with plenty of whole grains, cereals, fruits and vegetables, exercise regularly and maintain ideal body weight .I also counseled him to start using his CPAP for his sleep apnea He may consider possible participation in the [facility] stroke study and will be given information to review and decide.  Followup  in the future with my nurse practitioner in 3 months or call earlier if necessary Greater than 50% of time during this 25 minute visit was spent on counseling,explanation of diagnosis of cryptogenic stroke planning of further management, discussion with patient and family and coordination of care Justin Contras, MD  [facility] [address] [place], [place] [address]  Phone [phone] Fax [phone] Note: This document was prepared with digital dictation and possible smart phrase technology. Any transcriptional errors that result from this process are unintentional

## 2017-09-18 NOTE — Progress Notes (Signed)
Carelink Summary Report / Loop Recorder 

## 2017-09-24 LAB — CUP PACEART REMOTE DEVICE CHECK
Date Time Interrogation Session: 20181108154226
MDC IDC PG IMPLANT DT: 20181009

## 2017-10-08 DIAGNOSIS — R748 Abnormal levels of other serum enzymes: Secondary | ICD-10-CM | POA: Diagnosis not present

## 2017-10-13 ENCOUNTER — Telehealth: Payer: Self-pay | Admitting: Neurology

## 2017-10-13 NOTE — Telephone Encounter (Addendum)
Pt said HR is requesting a return to work letter that would include any restrictions. Pt said he is working part time right now, 4-5 hrs per day. Please send to [place] [phone]  [phone]  attn: Nolon Stalls

## 2017-10-13 NOTE — Telephone Encounter (Signed)
Patient called to check the status and I made him aware that Dr. Leonie [name] was still seeing patients and that they would contact him just as soon as they get to it. Patient voiced understanding.

## 2017-10-13 NOTE — Telephone Encounter (Signed)
Sent to Dr. Sethi.   

## 2017-10-14 NOTE — Telephone Encounter (Signed)
Letter can be done part time per Dr. Leonie Man until November 20, 2017. PT sees his primary doctor during the week. Rn stated a letter can be done by Dr. Leonie Man until he sees the neurologist. PT verbalized understanding. Letter will be fax.

## 2017-10-14 NOTE — Telephone Encounter (Signed)
Error

## 2017-10-14 NOTE — Telephone Encounter (Signed)
Rn call Jan with the letter that was fax. Jan stated the only question was could he do all job duties. She stated he have to sometimes ride a motorcycle if needed or help out the technicians. Rn stated per Dr Leonie Man neurological standpoint pt has no restrictions. Rn explain his primary doctor wrote him out for part time work. RN stated the PCP did the original letter for pt not the neurologist. RN stated pt is not getting any therapy and is doing well from the recent stroke. Rn stated pt stated his job needed a letter from the stroke MD. Jan verbalized understanding that pt can work part time 5 hours a day starting back immediately, till January 11,2019 when he sees his PCP.  PTs PCP if he can work full time at that time not Dr. Leonie Man.Marland Kitchen

## 2017-10-14 NOTE — Telephone Encounter (Signed)
Rn call patient back about needing a letter for work. Pt stated he had the stroke in 08/2017. His PCP wrote a letter for him stating he can work part time at his job. He has been working part time since the first of November 4 to 5 hours per day. Pt stated his PCP does not want him to overdue it with endurance. Pt stated human resources need another letter stating if he has restrictions for his job. The PCP letter was a generic letter, and they need one from the neurologist. Rn stated to pt his work ability is not mention in Dr. Leonie Man note. Rn stated Dr. Leonie Man is not in the office today he will return tomorrow. PT stated he stayed at work longer than 5 hours,and he got felt like it was too much.   Pt stated he would like to work part time till January when he sees his PCP. Also he sees Dr.Sethi in February 2019 for another follow up. Pt stated HR will not let him return to work till they get a letter from [employer]. Rn stated Dr. Leonie Man will be notified.

## 2017-10-14 NOTE — Telephone Encounter (Deleted)
I recommend a trial of Seroquel. Start 12.5 mg at night for 1 week and then increase to twice daily if tolerated. Continue the Depakote in the present dose for now. Patient also needs to schedule appointment to see me

## 2017-10-15 ENCOUNTER — Telehealth: Payer: Self-pay

## 2017-10-15 NOTE — Telephone Encounter (Signed)
Jan called said PCP and Dr Leonie Man talked last night and it was decided that Dr Leonie Man will decided what and if there are any jon restrictions. She is going to fax over job description to RN at [phone]. Any questions she can be reached at [phone].

## 2017-10-15 NOTE — Telephone Encounter (Addendum)
Letter fax to Jan x2. Fax receive and confirmed. Letter states: Letter was sign by Dr. Leonie Man.   [place] [place] [address]   To Whom It May Concern;  [name] is a patient at my neurological office. Patient was last seen 09/17/2017 for a stroke follow up. He can work part time at 5 hours per day till November 20 2017. He can return to work full time starting November 21, 2017. 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  Sincerely,    Antony Contras, MD

## 2017-10-15 NOTE — Telephone Encounter (Signed)
Rn call patient about the owner sending over job duties from his employer. PT stated Jan is the owner of the [employer]. Rn stated Jan reference pt sometimes have to ride a motorcycle with his job duties. Pt stated he is working part time, and has no problems during his job. He stated sometimes he has to ride a motorcycle when needed at his job.Pt stated he has even rode his own motorcycle, and is doing well. Rn stated Dr.Sethi will look over job duties again and resend another Quarry manager.

## 2017-10-15 NOTE — Telephone Encounter (Signed)
Revised. 

## 2017-10-16 NOTE — Telephone Encounter (Signed)
Patient calling to discuss corrected letter that was sent yesterday.

## 2017-10-16 NOTE — Telephone Encounter (Signed)
Rn call patient back and read him the letter from Dr. Leonie Man that was fax to human resources. Rn stated the letter was fax to Jan the owner on 10/15/2017 around 1730pm. PT stated he work yesterday and was told to go home until he hears from human resources. Rn stated Dr. Leonie Man spoke with Dr. Lin Landsman his PCP about him working part time till November 20 2017. Rn also stated job duties were fax from Jan on 10/15/2017. Dr Leonie Man review the job duties and stated he can work without restrictions part time till Nov 20, 2017 with the duties listed. Pt stated he has been there over 30 years at the job. He is getting a lawyer to protect himself just in case he cannot return if that's the case. Pt requested copy of DR. Sethi letter and the job duties. Rn stated letter will be put in the mail along with the copy of job duties.   Work Quarry manager, and job duties in Development worker, community. Address confirm from patient.

## 2017-10-16 NOTE — Telephone Encounter (Signed)
MAde in error

## 2017-10-19 ENCOUNTER — Ambulatory Visit (INDEPENDENT_AMBULATORY_CARE_PROVIDER_SITE_OTHER): Payer: 59 | Admitting: *Deleted

## 2017-10-19 DIAGNOSIS — I639 Cerebral infarction, unspecified: Secondary | ICD-10-CM | POA: Diagnosis not present

## 2017-10-19 NOTE — Progress Notes (Signed)
Carelink Summary Report / Loop Recorder 

## 2017-10-22 LAB — CUP PACEART REMOTE DEVICE CHECK
Date Time Interrogation Session: 20181208170944
MDC IDC PG IMPLANT DT: 20181009

## 2017-11-16 ENCOUNTER — Ambulatory Visit (INDEPENDENT_AMBULATORY_CARE_PROVIDER_SITE_OTHER): Payer: 59 | Admitting: *Deleted

## 2017-11-16 DIAGNOSIS — I639 Cerebral infarction, unspecified: Secondary | ICD-10-CM | POA: Diagnosis not present

## 2017-11-17 NOTE — Progress Notes (Signed)
Carelink Summary Report / Loop Recorder 

## 2017-11-19 ENCOUNTER — Other Ambulatory Visit: Payer: Self-pay

## 2017-11-20 ENCOUNTER — Other Ambulatory Visit: Payer: Self-pay

## 2017-11-24 ENCOUNTER — Other Ambulatory Visit: Payer: Self-pay

## 2017-11-24 DIAGNOSIS — E785 Hyperlipidemia, unspecified: Secondary | ICD-10-CM | POA: Diagnosis not present

## 2017-11-24 NOTE — Patient Outreach (Signed)
First/Second/Third attempt to obtain mRS. No answer. Left message for returned call. 7  

## 2017-11-28 LAB — CUP PACEART REMOTE DEVICE CHECK
Date Time Interrogation Session: 20190107174148
MDC IDC PG IMPLANT DT: 20181009

## 2017-11-30 DIAGNOSIS — E7800 Pure hypercholesterolemia, unspecified: Secondary | ICD-10-CM | POA: Diagnosis not present

## 2017-11-30 DIAGNOSIS — I10 Essential (primary) hypertension: Secondary | ICD-10-CM | POA: Diagnosis not present

## 2017-12-16 ENCOUNTER — Ambulatory Visit (INDEPENDENT_AMBULATORY_CARE_PROVIDER_SITE_OTHER): Payer: 59 | Admitting: *Deleted

## 2017-12-16 DIAGNOSIS — I639 Cerebral infarction, unspecified: Secondary | ICD-10-CM | POA: Diagnosis not present

## 2017-12-16 NOTE — Progress Notes (Signed)
Carelink Summary Report / Loop Recorder 

## 2017-12-18 ENCOUNTER — Ambulatory Visit: Payer: 59 | Admitting: Nurse Practitioner

## 2018-01-03 NOTE — Progress Notes (Signed)
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  MRA showed no large vessel intracranial stenosis.  He is currently on aspirin for secondary stroke prevention without further stroke or TIA symptoms.  He has no bruising and no bleeding blood pressure in the office today 136/79.  Loop recorder so far no A. Fib.  He remains on Lipitor 40 mg daily, the dose was reduced from 80 and he no longer has muscle cramps.  He exercises daily walking 3 miles.  He is also lost about 30 pounds.  He is not using his CPAP as he cannot afford it.  He was commended on his weight loss.  He does not smoke and has rare alcoholic beverage.  He complains of pain in the right ear.  He returns for reevaluation 09/27/17 Justin Justin [name] seen today for first office follow-up visit following hospital admission for stroke in October 2018.  He is accompanied by his wife.  History is obtained from them as well as review of electronic medical records.  I have personally reviewed imaging films.Justin [name] a 57 y.o.malewith no past medical history who presented to [facility] with sudden onset of right arm and leg weakness. He reported to the emergency room doctors there that he was working and noticed that he could not move the right hand. He had to focus hard to make his right arm movement. When it started becoming more difficult, that is when he decided to come to the hospital. Also had word finding difficulty. Also complains of headache. He was evaluated by telemedicine neurology or at [facility] ER, and they suspected a stroke. Decision was made to use IV TPA. His systolic blood pressure on presentation was in the 230s for the phone call report.  I do not have the telemedicine neurology note to review.TPA bolus was administered, but because of inability to control his blood pressures, decision was made by the team over and round TO not pursue the infusion of TPA. He was transferred to [facility] for higher level of care as they do not have neurology coverage over at [facility].  He was admitted to the neuro ICU for monitoring after partial dose of TPA administered at [facility].  His blood pressure was tightly controlled.  MRI scan of the brain showed small right superior cerebellar embolic stroke.  MRA showed no large vessel intracranial stenosis.  Carotid ultrasound showed no significant extracranial stenosis.  LDL cholesterol was elevated at 131 mg percent.  Hemoglobin A1c was 5.2.  Transthoracic echo showed normal ejection fraction.  Patient subsequently had outpatient TEE which showed no cardiac source of embolism and had loop recorder inserted by Dr. Rayann Heman on 08/31/17 and so far paroxysmal A. fib has not yet been found.  Patient was discharged home and has finished physical therapy.  He states he occasionally has some slurred speech and incoordination particularly when he is tired or tries to walk quickly.  He also complains of mild headaches and some light sensitivity.  He states his blood pressure is well controlled and today it is 1 1 8/72.  He is tolerating Lipitor with only minor muscle aches.  Is tolerating aspirin without bruising or bleeding.  He has been diagnosed with sleep apnea and is awaiting arrival  of his CPAP mask and plans to use it regularly.  He also knows  that he has to go on a diet and lose weight and be more active.   REVIEW OF SYSTEMS: Full 14 system review of systems performed and notable only for those listed, all others are neg:  Constitutional: neg  Cardiovascular: neg Ear/Nose/Throat: neg  Skin: neg Eyes: neg Respiratory: neg Gastroitestinal: neg  Hematology/Lymphatic: neg  Endocrine:  neg Musculoskeletal:neg Allergy/Immunology: neg Neurological: neg Psychiatric: neg Sleep : neg   ALLERGIES: Allergies  Allergen Reactions  . Ciprofloxacin Anaphylaxis and Swelling  . Penicillins Anaphylaxis and Swelling    Has patient had a PCN reaction causing immediate rash, facial/tongue/throat swelling, SOB or lightheadedness with hypotension: Yes Has patient had a PCN reaction causing severe rash involving mucus membranes or skin necrosis: Yes Has patient had a PCN reaction that required hospitalization: Yes Has patient had a PCN reaction occurring within the last 10 years: Yes If all of the above answers are "NO", then may proceed with Cephalosporin use.     HOME MEDICATIONS: Outpatient Medications Prior to Visit  Medication Sig Dispense Refill  . acetaminophen (TYLENOL) 650 MG CR tablet Take 1,300 mg by mouth every 8 (eight) hours as needed for pain.    Marland Kitchen amLODipine (NORVASC) 5 MG tablet Take 5 mg by mouth daily.    Marland Kitchen aspirin EC 81 MG EC tablet Take 1 tablet (81 mg total) by mouth daily. 180 tablet 1  . atorvastatin (LIPITOR) 80 MG tablet Take 1 tablet (80 mg total) by mouth daily at 6 PM. 30 tablet 5  . hydrochlorothiazide (HYDRODIURIL) 25 MG tablet Take 1 tablet (25 mg total) by mouth daily. 30 tablet 0  . lisinopril (PRINIVIL,ZESTRIL) 10 MG tablet Take 2 tablets (20 mg total) by mouth 2 (two) times daily. 30 tablet 0   No facility-administered medications prior to visit.     PAST MEDICAL HISTORY: Past Medical History:  Diagnosis Date  . Hypertension   . Stroke [facility])     PAST SURGICAL HISTORY: Past Surgical History:  Procedure Laterality Date  . LOOP RECORDER INSERTION N/A 08/18/2017   Procedure: LOOP RECORDER INSERTION;  Surgeon: [name], MD;  Location: [place] [facility];  Service: Cardiovascular;  Laterality: N/A;  . TEE WITHOUT CARDIOVERSION N/A 08/18/2017   Procedure: TRANSESOPHAGEAL ECHOCARDIOGRAM (TEE);  Surgeon: Pixie Casino, MD;  Location: [facility]  ENDOSCOPY;  Service: Cardiovascular;  Laterality: N/A;    FAMILY HISTORY: Family History  Problem Relation Age of Onset  . Stroke Mother   . Hypertension Mother   . Stroke Sister   . Hypertension Sister     SOCIAL HISTORY: Social History   Socioeconomic History  . Marital status: Married    Spouse name: Not on file  . Number of children: Not on file  . Years of education: Not on file  . Highest education level: Not on file  Social Needs  . Financial resource strain: Not on file  . Food insecurity - worry: Not on file  . Food insecurity - inability: Not on file  . Transportation needs - medical: Not on file  . Transportation needs - non-medical: Not on file  Occupational History  . Not on file  Tobacco Use  . Smoking status: Never Smoker  . Smokeless tobacco: Never Used  Substance and Sexual Activity  . Alcohol use: No    Frequency: Never  . Drug use: No  . Sexual activity: Not on file  Other Topics Concern  .  Not on file  Social History Narrative  . Not on file     PHYSICAL EXAM  Vitals:   01/04/18 0904  BP: 136/79  Pulse: 68  Weight: 283 lb 9.6 oz (128.6 kg)  Height: 6\' 1"  (1.854 m)   Body mass index is 37.42 kg/m.  Generalized: Well developed, obese [name] in no acute distress  Head: normocephalic and atraumatic,. Oropharynx benign bilateral cerumen right greater than left  Neck: Supple, no carotid bruits  Cardiac: Regular rate rhythm, no murmur  Musculoskeletal: No deformity   Neurological examination   Mentation: Alert oriented to time, place, history taking. Attention span and concentration appropriate. Recent and remote memory intact.  Follows all commands speech and language fluent.   Cranial nerve II-XII: Pupils were equal round reactive to light extraocular movements were full, visual field were full on confrontational test. Facial sensation and strength were normal. hearing was intact to finger rubbing bilaterally. Uvula tongue midline. head  turning and shoulder shrug were normal and symmetric.Tongue protrusion into cheek strength was normal. Motor: normal bulk and tone, full strength in the BUE, BLE, fine finger movements normal, no pronator drift. No focal weakness Sensory: normal and symmetric to light touch, pinprick, and  Vibration in the upper and lower extremities  Coordination: finger-nose-finger, heel-to-shin bilaterally, no dysmetria Reflexes: 1+ upper lower and symmetric plantar responses were flexor bilaterally. Gait and Station: Rising up from seated position without assistance, normal stance,  moderate stride, good arm swing, smooth turning, able to perform tiptoe, and heel walking without difficulty. Tandem gait is steady  DIAGNOSTIC DATA (LABS, IMAGING, TESTING) - I reviewed patient records, labs, notes, testing and imaging myself where available.  Lab Results  Component Value Date   WBC 10.5 08/16/2017   HGB 16.6 08/16/2017   HCT 48.8 08/16/2017   MCV 92.1 08/16/2017   PLT 213 08/16/2017      Component Value Date/Time   NA 141 08/16/2017 0821   K 4.0 08/16/2017 0821   CL 105 08/16/2017 0821   CO2 28 08/16/2017 0821   GLUCOSE 97 08/16/2017 0821   BUN 12 08/16/2017 0821   CREATININE 1.00 08/16/2017 0821   CALCIUM 9.4 08/16/2017 0821   PROT 7.5 08/16/2017 0821   ALBUMIN 3.7 08/16/2017 0821   AST 41 08/16/2017 0821   ALT 70 (H) 08/16/2017 0821   ALKPHOS 85 08/16/2017 0821   BILITOT 1.3 (H) 08/16/2017 0821   GFRNONAA >60 08/16/2017 0821   GFRAA >60 08/16/2017 0821   Lab Results  Component Value Date   CHOL 185 08/12/2017   HDL 41 08/12/2017   LDLCALC 131 (H) 08/12/2017   TRIG 63 08/12/2017   CHOLHDL 4.5 08/12/2017   Lab Results  Component Value Date   HGBA1C 5.2 08/12/2017     ASSESSMENT AND PLAN 40year Caucasian [name] with 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  Vascular risk factors of hypertension, hyperlipidemia, sleep apnea and  obesity    PLAN:Stressed the importance of management of risk factors to prevent further stroke Continue aspirin for secondary stroke prevention Maintain strict control of hypertension with blood pressure goal below 130/90, today's reading136/79 continue antihypertensive medications Control of diabetes with hemoglobin A1c below 6.5 followed by primary care  Cholesterol with LDL cholesterol less than 70, followed by primary care,  Continue Lipitor 40mg  daily Exercise by walking, every day at least 30 min  healthy diet with whole grains,  fresh fruits and vegetables Loop recorder so far no atrial fib Has lost  weight and so far not using CPAP due to expense F/U 6 months if stable will discharge  I spent 25 minutes in total face to face time with the patient more than 50% of which was spent counseling and coordination of care, reviewing test results reviewing medications and discussing and reviewing the diagnosis of stroke and management of risk factors.  Additional questions answered Dennie Bible, [facility], [facility], APRN  [facility] [address] [place], [place] [address] [phone]

## 2018-01-04 ENCOUNTER — Ambulatory Visit: Payer: 59 | Admitting: Nurse Practitioner

## 2018-01-04 ENCOUNTER — Encounter: Payer: Self-pay | Admitting: Nurse Practitioner

## 2018-01-04 VITALS — BP 136/79 | HR 68 | Ht 73.0 in | Wt 283.6 lb

## 2018-01-04 DIAGNOSIS — G4733 Obstructive sleep apnea (adult) (pediatric): Secondary | ICD-10-CM | POA: Diagnosis not present

## 2018-01-04 DIAGNOSIS — I6300 Cerebral infarction due to thrombosis of unspecified precerebral artery: Secondary | ICD-10-CM

## 2018-01-04 DIAGNOSIS — I10 Essential (primary) hypertension: Secondary | ICD-10-CM | POA: Insufficient documentation

## 2018-01-04 DIAGNOSIS — E785 Hyperlipidemia, unspecified: Secondary | ICD-10-CM | POA: Insufficient documentation

## 2018-01-04 NOTE — Progress Notes (Signed)
I agree with the above plan 

## 2018-01-04 NOTE — Patient Instructions (Signed)
Stressed the importance of management of risk factors to prevent further stroke Continue aspirin for secondary stroke prevention Maintain strict control of hypertension with blood pressure goal below 130/90, today's reading136/79 continue antihypertensive medications Control of diabetes with hemoglobin A1c below 6.5 followed by primary care  Cholesterol with LDL cholesterol less than 70, followed by primary care,  Continue Lipitor Exercise by walking, every day   healthy diet with whole grains,  fresh fruits and vegetables Loop recorder no atrial fib Has lost weight and so far not using CPAP F/U 6 months if stable will discharge

## 2018-01-08 LAB — CUP PACEART REMOTE DEVICE CHECK
Implantable Pulse Generator Implant Date: 20181009
MDC IDC SESS DTM: 20190206183941

## 2018-01-18 ENCOUNTER — Ambulatory Visit (INDEPENDENT_AMBULATORY_CARE_PROVIDER_SITE_OTHER): Payer: 59 | Admitting: *Deleted

## 2018-01-18 DIAGNOSIS — I639 Cerebral infarction, unspecified: Secondary | ICD-10-CM

## 2018-01-19 NOTE — Progress Notes (Signed)
Carelink Summary Report / Loop Recorder 

## 2018-02-22 ENCOUNTER — Ambulatory Visit (INDEPENDENT_AMBULATORY_CARE_PROVIDER_SITE_OTHER): Payer: 59 | Admitting: *Deleted

## 2018-02-22 DIAGNOSIS — I639 Cerebral infarction, unspecified: Secondary | ICD-10-CM | POA: Diagnosis not present

## 2018-02-22 NOTE — Progress Notes (Signed)
Carelink Summary Report / Loop Recorder 

## 2018-02-25 DIAGNOSIS — C44712 Basal cell carcinoma of skin of right lower limb, including hip: Secondary | ICD-10-CM | POA: Diagnosis not present

## 2018-02-25 LAB — CUP PACEART REMOTE DEVICE CHECK
Date Time Interrogation Session: 20190311194128
MDC IDC PG IMPLANT DT: 20181009

## 2018-02-27 DIAGNOSIS — G8918 Other acute postprocedural pain: Secondary | ICD-10-CM | POA: Diagnosis not present

## 2018-03-03 DIAGNOSIS — Z6836 Body mass index (BMI) 36.0-36.9, adult: Secondary | ICD-10-CM | POA: Diagnosis not present

## 2018-03-03 DIAGNOSIS — Z125 Encounter for screening for malignant neoplasm of prostate: Secondary | ICD-10-CM | POA: Diagnosis not present

## 2018-03-03 DIAGNOSIS — Z0000 Encounter for general adult medical examination without abnormal findings: Secondary | ICD-10-CM | POA: Diagnosis not present

## 2018-03-10 DIAGNOSIS — D510 Vitamin B12 deficiency anemia due to intrinsic factor deficiency: Secondary | ICD-10-CM | POA: Diagnosis not present

## 2018-03-17 DIAGNOSIS — D510 Vitamin B12 deficiency anemia due to intrinsic factor deficiency: Secondary | ICD-10-CM | POA: Diagnosis not present

## 2018-03-24 DIAGNOSIS — D510 Vitamin B12 deficiency anemia due to intrinsic factor deficiency: Secondary | ICD-10-CM | POA: Diagnosis not present

## 2018-03-25 ENCOUNTER — Ambulatory Visit (INDEPENDENT_AMBULATORY_CARE_PROVIDER_SITE_OTHER): Payer: 59 | Admitting: *Deleted

## 2018-03-25 DIAGNOSIS — I639 Cerebral infarction, unspecified: Secondary | ICD-10-CM | POA: Diagnosis not present

## 2018-03-25 DIAGNOSIS — C44712 Basal cell carcinoma of skin of right lower limb, including hip: Secondary | ICD-10-CM | POA: Diagnosis not present

## 2018-03-26 NOTE — Progress Notes (Signed)
Carelink Summary Report / Loop Recorder 

## 2018-03-29 LAB — CUP PACEART REMOTE DEVICE CHECK
Date Time Interrogation Session: 20190413193828
MDC IDC PG IMPLANT DT: 20181009

## 2018-04-01 DIAGNOSIS — D510 Vitamin B12 deficiency anemia due to intrinsic factor deficiency: Secondary | ICD-10-CM | POA: Diagnosis not present

## 2018-04-16 LAB — CUP PACEART REMOTE DEVICE CHECK
Implantable Pulse Generator Implant Date: 20181009
MDC IDC SESS DTM: 20190516200746

## 2018-04-27 ENCOUNTER — Ambulatory Visit (INDEPENDENT_AMBULATORY_CARE_PROVIDER_SITE_OTHER): Payer: 59 | Admitting: *Deleted

## 2018-04-27 DIAGNOSIS — I639 Cerebral infarction, unspecified: Secondary | ICD-10-CM | POA: Diagnosis not present

## 2018-04-28 NOTE — Progress Notes (Signed)
Carelink Summary Report / Loop Recorder 

## 2018-05-11 DIAGNOSIS — D485 Neoplasm of uncertain behavior of skin: Secondary | ICD-10-CM | POA: Diagnosis not present

## 2018-05-11 DIAGNOSIS — C44712 Basal cell carcinoma of skin of right lower limb, including hip: Secondary | ICD-10-CM | POA: Diagnosis not present

## 2018-05-31 ENCOUNTER — Ambulatory Visit (INDEPENDENT_AMBULATORY_CARE_PROVIDER_SITE_OTHER): Payer: 59 | Admitting: *Deleted

## 2018-05-31 DIAGNOSIS — I639 Cerebral infarction, unspecified: Secondary | ICD-10-CM

## 2018-05-31 NOTE — Progress Notes (Signed)
Carelink Summary Report / Loop Recorder 

## 2018-06-02 LAB — CUP PACEART REMOTE DEVICE CHECK
Implantable Pulse Generator Implant Date: 20181009
MDC IDC SESS DTM: 20190618214050

## 2018-06-15 DIAGNOSIS — D510 Vitamin B12 deficiency anemia due to intrinsic factor deficiency: Secondary | ICD-10-CM | POA: Diagnosis not present

## 2018-07-02 ENCOUNTER — Ambulatory Visit (INDEPENDENT_AMBULATORY_CARE_PROVIDER_SITE_OTHER): Payer: 59 | Admitting: *Deleted

## 2018-07-02 DIAGNOSIS — I639 Cerebral infarction, unspecified: Secondary | ICD-10-CM

## 2018-07-05 NOTE — Progress Notes (Signed)
Carelink Summary Report / Loop Recorder 

## 2018-07-08 ENCOUNTER — Ambulatory Visit: Payer: 59 | Admitting: Nurse Practitioner

## 2018-07-14 NOTE — Progress Notes (Signed)
[facility]  PATIENT: Justin Justin [name] DOB: Mar 31, 1961   REASON FOR VISIT: Follow-up for stroke HISTORY FROM: Patient   HISTORY OF PRESENT ILLNESS:UPDATE 9/6/2019CM Justin Justin [name], 57 year old male returns for follow-up with history of embolic stroke in October 2018.  He is currently on aspirin for secondary stroke prevention without further stroke or TIA symptoms.  He has no bruising and no bleeding.  Loop recorder so far no atrial fibrillation.  He remains on  Lipitor without myalgias.  Blood pressure in the office today 123/74.  He continues to lose weight.  He does not use his CPAP because he cannot afford the machine he says he wants to get his bills paid off.  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  MRA showed no large vessel intracranial stenosis.  He is currently on aspirin for secondary stroke prevention without further stroke or TIA symptoms.  He has no bruising and no bleeding blood pressure in the office today 136/79.  Loop recorder so far no A. Fib.  He remains on Lipitor 40 mg daily, the dose was reduced from 80 and he no longer has muscle cramps.  He exercises daily walking 3 miles.  He is also lost about 30 pounds.  He is not using his CPAP as he cannot afford it.  He was commended on his weight loss.  He does not smoke and has rare alcoholic beverage.  He complains of pain in the right ear.  He returns for reevaluation 09/27/17 PSMr Justin [name] is a 24 year Caucasian male seen today for first office follow-up visit following hospital admission for stroke in October 2018.  He is accompanied by his wife.  History is obtained from them as well as review of electronic medical records.  I have personally reviewed imaging films.Justin Justin [name] a 57 y.o.malewith no past medical history who presented to [facility] with sudden onset of right arm and leg weakness. He reported to the emergency room doctors there that he was working and noticed that he could not move the right hand. He had to focus hard to make his right arm movement. When it started becoming more difficult, that is when he decided to come to the hospital. Also had word finding difficulty. Also complains of headache. He was evaluated by telemedicine neurology or at [facility] ER, and they suspected a stroke. Decision was made to use IV TPA. His systolic blood pressure on presentation was in the 230s for the phone call report. I do not have the telemedicine neurology note to review.TPA bolus was administered, but because of inability to control his blood pressures, decision was made by the team over and round TO not pursue the infusion of TPA. He was transferred to [facility] for higher level of care as they do not have neurology coverage over at [facility].  He was admitted to the neuro ICU for monitoring after partial dose of TPA administered at [facility].  His blood pressure was tightly controlled.  MRI scan of the brain showed small right superior cerebellar embolic stroke.  MRA showed no large vessel intracranial stenosis.  Carotid ultrasound showed no significant extracranial stenosis.  LDL cholesterol was elevated at 131 mg percent.  Hemoglobin A1c was 5.2.  Transthoracic echo showed normal ejection fraction.  Patient subsequently had outpatient TEE which showed no cardiac source of embolism  and had loop recorder inserted by Dr. Rayann [name] on 08/31/17 and so far paroxysmal A. fib has not yet been found.  Patient was discharged home and has finished physical therapy.  He states he occasionally has some slurred speech and incoordination particularly when he is tired or tries to walk quickly.  He also complains of mild headaches and some light sensitivity.  He states his blood pressure is well controlled and today it is 1 1 8/72.  He is tolerating Lipitor  with only minor muscle aches.  Is tolerating aspirin without bruising or bleeding.  He has been diagnosed with sleep apnea and is awaiting arrival of his CPAP mask and plans to use it regularly.  He also knows  that he has to go on a diet and lose weight and be more active.   REVIEW OF SYSTEMS: Full 14 system review of systems performed and notable only for those listed, all others are neg:  Constitutional: neg  Cardiovascular: neg Ear/Nose/Throat: neg  Skin: neg Eyes: neg Respiratory: neg Gastroitestinal: neg  Hematology/Lymphatic: neg  Endocrine: neg Musculoskeletal:neg Allergy/Immunology: neg Neurological: neg Psychiatric: neg Sleep : neg   ALLERGIES: Allergies  Allergen Reactions  . Ciprofloxacin Anaphylaxis and Swelling  . Penicillins Anaphylaxis and Swelling    Has patient had a PCN reaction causing immediate rash, facial/tongue/throat swelling, SOB or lightheadedness with hypotension: Yes Has patient had a PCN reaction causing severe rash involving mucus membranes or skin necrosis: Yes Has patient had a PCN reaction that required hospitalization: Yes Has patient had a PCN reaction occurring within the last 10 years: Yes If all of the above answers are "NO", then may proceed with Cephalosporin use.     HOME MEDICATIONS: Outpatient Medications Prior to Visit  Medication Sig Dispense Refill  . acetaminophen (TYLENOL) 650 MG CR tablet Take 1,300 mg by mouth every 8 (eight) hours as needed for pain.    Marland Kitchen amLODipine (NORVASC) 10 MG tablet Take 10 mg by mouth daily.  1  . aspirin EC 81 MG EC tablet Take 1 tablet (81 mg total) by mouth daily. 180 tablet 1  . atorvastatin (LIPITOR) 80 MG tablet Take 1 tablet (80 mg total) by mouth daily at 6 PM. 30 tablet 5  . hydrochlorothiazide (HYDRODIURIL) 25 MG tablet Take 1 tablet (25 mg total) by mouth daily. 30 tablet 0  . lisinopril (PRINIVIL,ZESTRIL) 40 MG tablet Take 40 mg by mouth daily.  1  . amLODipine (NORVASC) 5 MG tablet Take  5 mg by mouth daily.    Marland Kitchen lisinopril (PRINIVIL,ZESTRIL) 10 MG tablet Take 2 tablets (20 mg total) by mouth 2 (two) times daily. 30 tablet 0   No facility-administered medications prior to visit.     PAST MEDICAL HISTORY: Past Medical History:  Diagnosis Date  . Hypertension   . Stroke [facility])     PAST SURGICAL HISTORY: Past Surgical History:  Procedure Laterality Date  . LOOP RECORDER INSERTION N/A 08/18/2017   Procedure: LOOP RECORDER INSERTION;  Surgeon: [name], MD;  Location: [facility];  Service: Cardiovascular;  Laterality: N/A;  . TEE WITHOUT CARDIOVERSION N/A 08/18/2017   Procedure: TRANSESOPHAGEAL ECHOCARDIOGRAM (TEE);  Surgeon: Pixie Casino, MD;  Location: [facility] ENDOSCOPY;  Service: Cardiovascular;  Laterality: N/A;    FAMILY HISTORY: Family History  Problem Relation Age of Onset  . Stroke Mother   . Hypertension Mother   . Stroke Sister   . Hypertension Sister     SOCIAL HISTORY: Social History   Socioeconomic  History  . Marital status: Married    Spouse name: Not on file  . Number of children: Not on file  . Years of education: Not on file  . Highest education level: Not on file  Occupational History  . Not on file  Social Needs  . Financial resource strain: Not on file  . Food insecurity:    Worry: Not on file    Inability: Not on file  . Transportation needs:    Medical: Not on file    Non-medical: Not on file  Tobacco Use  . Smoking status: Never Smoker  . Smokeless tobacco: Never Used  Substance and Sexual Activity  . Alcohol use: No    Frequency: Never  . Drug use: No  . Sexual activity: Not on file  Lifestyle  . Physical activity:    Days per week: Not on file    Minutes per session: Not on file  . Stress: Not on file  Relationships  . Social connections:    Talks on phone: Not on file    Gets together: Not on file    Attends religious service: Not on file    Active member of club or organization: Not on file    Attends  meetings of clubs or organizations: Not on file    Relationship status: Not on file  . Intimate partner violence:    Fear of current or ex partner: Not on file    Emotionally abused: Not on file    Physically abused: Not on file    Forced sexual activity: Not on file  Other Topics Concern  . Not on file  Social History Narrative  . Not on file     PHYSICAL EXAM  Vitals:   07/16/18 0755  BP: 123/74  Pulse: 67  Weight: 281 lb (127.5 kg)  Height: 6\' 1"  (1.854 m)   Body mass index is 37.07 kg/m.  Generalized: Well developed, obese male in no acute distress  Head: normocephalic and atraumatic,. Oropharynx benign   Neck: Supple, no carotid bruits  Cardiac: Regular rate rhythm, no murmur  Musculoskeletal: No deformity   Neurological examination   Mentation: Alert oriented to time, place, history taking. Attention span and concentration appropriate. Recent and remote memory intact.  Follows all commands speech and language fluent.   Cranial nerve II-XII: Pupils were equal round reactive to light extraocular movements were full, visual field were full on confrontational test. Facial sensation and strength were normal. hearing was intact to finger rubbing bilaterally. Uvula tongue midline. head turning and shoulder shrug were normal and symmetric.Tongue protrusion into cheek strength was normal. Motor: normal bulk and tone, full strength in the BUE, BLE, fine finger movements normal, no pronator drift. No focal weakness Sensory: normal and symmetric to light touch, pinprick, and  Vibration in the upper and lower extremities  Coordination: finger-nose-finger, heel-to-shin bilaterally, no dysmetria Reflexes: 1+ upper lower and symmetric plantar responses were flexor bilaterally. Gait and Station: Rising up from seated position without assistance, normal stance,  moderate stride, good arm swing, smooth turning, able to perform tiptoe, and heel walking without difficulty. Tandem gait is  steady  DIAGNOSTIC DATA (LABS, IMAGING, TESTING) - I reviewed patient records, labs, notes, testing and imaging myself where available.  Lab Results  Component Value Date   WBC 10.5 08/16/2017   HGB 16.6 08/16/2017   HCT 48.8 08/16/2017   MCV 92.1 08/16/2017   PLT 213 08/16/2017      Component Value Date/Time  NA 141 08/16/2017 0821   K 4.0 08/16/2017 0821   CL 105 08/16/2017 0821   CO2 28 08/16/2017 0821   GLUCOSE 97 08/16/2017 0821   BUN 12 08/16/2017 0821   CREATININE 1.00 08/16/2017 0821   CALCIUM 9.4 08/16/2017 0821   PROT 7.5 08/16/2017 0821   ALBUMIN 3.7 08/16/2017 0821   AST 41 08/16/2017 0821   ALT 70 (H) 08/16/2017 0821   ALKPHOS 85 08/16/2017 0821   BILITOT 1.3 (H) 08/16/2017 0821   GFRNONAA >60 08/16/2017 0821   GFRAA >60 08/16/2017 0821   Lab Results  Component Value Date   CHOL 185 08/12/2017   HDL 41 08/12/2017   LDLCALC 131 (H) 08/12/2017   TRIG 63 08/12/2017   CHOLHDL 4.5 08/12/2017   Lab Results  Component Value Date   HGBA1C 5.2 08/12/2017     ASSESSMENT AND PLAN 4year Caucasian male with 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  Vascular risk factors of hypertension, hyperlipidemia, sleep apnea and obesity. The patient is a current patient of Dr. Leonie Man who is out of the office today . This note is sent to the work in doctor.       PLAN:Stressed the importance of management of risk factors to prevent further stroke Continue aspirin for secondary stroke prevention Maintain strict control of hypertension with blood pressure goal below 130/90, today's reading 123/74 continue antihypertensive medications Control of diabetes with hemoglobin A1c below 6.5 followed by primary care  Cholesterol with LDL cholesterol less than 70, followed by primary care,  Continue Lipitor  Exercise by walking, every day at least 30 min  healthy diet with whole grains,  fresh fruits and vegetables Loop recorder so far no atrial  fib Has lost weight and so far not using CPAP due to expense Will discharge from stroke clinic  I spent 25 minutes in total face to face time with the patient more than 50% of which was spent counseling and coordination of care, reviewing test results reviewing medications and discussing and reviewing the diagnosis of stroke and management of risk factors.  Additional questions answered.  Patient was also given a written handout regarding stroke prevention Dennie Bible, [facility], [facility], APRN  [facility] [address] [place], [place] [address] [phone]

## 2018-07-16 ENCOUNTER — Ambulatory Visit: Payer: 59 | Admitting: Nurse Practitioner

## 2018-07-16 ENCOUNTER — Encounter: Payer: Self-pay | Admitting: Nurse Practitioner

## 2018-07-16 VITALS — BP 123/74 | HR 67 | Ht 73.0 in | Wt 281.0 lb

## 2018-07-16 DIAGNOSIS — I6300 Cerebral infarction due to thrombosis of unspecified precerebral artery: Secondary | ICD-10-CM | POA: Diagnosis not present

## 2018-07-16 DIAGNOSIS — G4733 Obstructive sleep apnea (adult) (pediatric): Secondary | ICD-10-CM | POA: Diagnosis not present

## 2018-07-16 DIAGNOSIS — I10 Essential (primary) hypertension: Secondary | ICD-10-CM | POA: Diagnosis not present

## 2018-07-16 DIAGNOSIS — E785 Hyperlipidemia, unspecified: Secondary | ICD-10-CM | POA: Diagnosis not present

## 2018-07-16 LAB — CUP PACEART REMOTE DEVICE CHECK
Date Time Interrogation Session: 20190721214115
Implantable Pulse Generator Implant Date: 20181009

## 2018-07-16 NOTE — Patient Instructions (Addendum)
Stressed the importance of management of risk factors to prevent further stroke Continue aspirin for secondary stroke prevention Maintain strict control of hypertension with blood pressure goal below 130/90, today's reading 123/74 continue antihypertensive medications Control of diabetes with hemoglobin A1c below 6.5 followed by primary care  Cholesterol with LDL cholesterol less than 70, followed by primary care,  Continue Lipitor  Exercise by walking, every day at least 30 min  healthy diet with whole grains,  fresh fruits and vegetables Loop recorder so far no atrial fib Has lost weight and so far not using CPAP due to expense Will discharge from stroke clinic  Stroke Prevention Some health problems and behaviors may make it more likely for you to have a stroke. Below are ways to lessen your risk of having a stroke.  Be active for at least 30 minutes on most or all days.  Do not smoke. Try not to be around others who smoke.  Do not drink too much alcohol. ? Do not have more than 2 drinks a day if you are a man. ? Do not have more than 1 drink a day if you are a woman and are not pregnant.  Eat healthy foods, such as fruits and vegetables. If you were put on a specific diet, follow the diet as told.  Keep your cholesterol levels under control through diet and medicines. Look for foods that are low in saturated fat, trans fat, cholesterol, and are high in fiber.  If you have diabetes, follow all diet plans and take your medicine as told.  Ask your doctor if you need treatment to lower your blood pressure. If you have high blood pressure (hypertension), follow all diet plans and take your medicine as told by your doctor.  If you are 48-29 years old, have your blood pressure checked every 3-5 years. If you are age 53 or older, have your blood pressure checked every year.  Keep a healthy weight. Eat foods that are low in calories, salt, saturated fat, trans fat, and cholesterol.  Do  not take drugs.  Avoid birth control pills, if this applies. Talk to your doctor about the risks of taking birth control pills.  Talk to your doctor if you have sleep problems (sleep apnea).  Take all medicine as told by your doctor. ? You may be told to take aspirin or blood thinner medicine. Take this medicine as told by your doctor. ? Understand your medicine instructions.  Make sure any other conditions you have are being taken care of.  Get help right away if:  You suddenly lose feeling (you feel numb) or have weakness in your face, arm, or leg.  Your face or eyelid hangs down to one side.  You suddenly feel confused.  You have trouble talking (aphasia) or understanding what people are saying.  You suddenly have trouble seeing in one or both eyes.  You suddenly have trouble walking.  You are dizzy.  You lose your balance or your movements are clumsy (uncoordinated).  You suddenly have a very bad headache and you do not know the cause.  You have new chest pain.  Your heart feels like it is fluttering or skipping a beat (irregular heartbeat). Do not wait to see if the symptoms above go away. Get help right away. Call your local emergency services (911 in [place] Do not drive yourself to the hospital. This information is not intended to replace advice given to you by your health care provider. Make sure you  discuss any questions you have with your health care provider. Document Released: 04/27/2012 Document Revised: 04/03/2016 Document Reviewed: 04/29/2013 Elsevier Interactive Patient Education  [employer].

## 2018-07-16 NOTE — Progress Notes (Signed)
I have read the note, and I agree with the clinical assessment and plan.  Charles K Willis   

## 2018-08-03 LAB — CUP PACEART REMOTE DEVICE CHECK
Date Time Interrogation Session: 20190823223959
MDC IDC PG IMPLANT DT: 20181009

## 2018-08-04 ENCOUNTER — Ambulatory Visit (INDEPENDENT_AMBULATORY_CARE_PROVIDER_SITE_OTHER): Payer: 59 | Admitting: *Deleted

## 2018-08-04 DIAGNOSIS — I639 Cerebral infarction, unspecified: Secondary | ICD-10-CM | POA: Diagnosis not present

## 2018-08-05 NOTE — Progress Notes (Signed)
Carelink Summary Report / Loop Recorder 

## 2018-08-09 LAB — CUP PACEART REMOTE DEVICE CHECK
Date Time Interrogation Session: 20190925223533
MDC IDC PG IMPLANT DT: 20181009

## 2018-08-10 DIAGNOSIS — Z6837 Body mass index (BMI) 37.0-37.9, adult: Secondary | ICD-10-CM | POA: Diagnosis not present

## 2018-08-10 DIAGNOSIS — I10 Essential (primary) hypertension: Secondary | ICD-10-CM | POA: Diagnosis not present

## 2018-08-31 DIAGNOSIS — C44712 Basal cell carcinoma of skin of right lower limb, including hip: Secondary | ICD-10-CM | POA: Diagnosis not present

## 2018-08-31 DIAGNOSIS — D224 Melanocytic nevi of scalp and neck: Secondary | ICD-10-CM | POA: Diagnosis not present

## 2018-09-06 ENCOUNTER — Ambulatory Visit (INDEPENDENT_AMBULATORY_CARE_PROVIDER_SITE_OTHER): Payer: 59 | Admitting: *Deleted

## 2018-09-06 DIAGNOSIS — I639 Cerebral infarction, unspecified: Secondary | ICD-10-CM | POA: Diagnosis not present

## 2018-09-07 NOTE — Progress Notes (Signed)
Carelink Summary Report / Loop Recorder 

## 2018-09-29 LAB — CUP PACEART REMOTE DEVICE CHECK
Date Time Interrogation Session: 20191028233906
MDC IDC PG IMPLANT DT: 20181009

## 2018-10-11 ENCOUNTER — Ambulatory Visit (INDEPENDENT_AMBULATORY_CARE_PROVIDER_SITE_OTHER): Payer: 59

## 2018-10-11 DIAGNOSIS — I639 Cerebral infarction, unspecified: Secondary | ICD-10-CM

## 2018-10-11 NOTE — Progress Notes (Signed)
Carelink Summary Report / Loop Recorder 

## 2018-10-22 IMAGING — DX DG CHEST 1V PORT
1 series · 1 of 1 positions shown · non-contrast
Comparison: None.

CLINICAL DATA: Right-sided numbness with slurred speech

EXAM:
PORTABLE CHEST 1 VIEW

[chest ap]
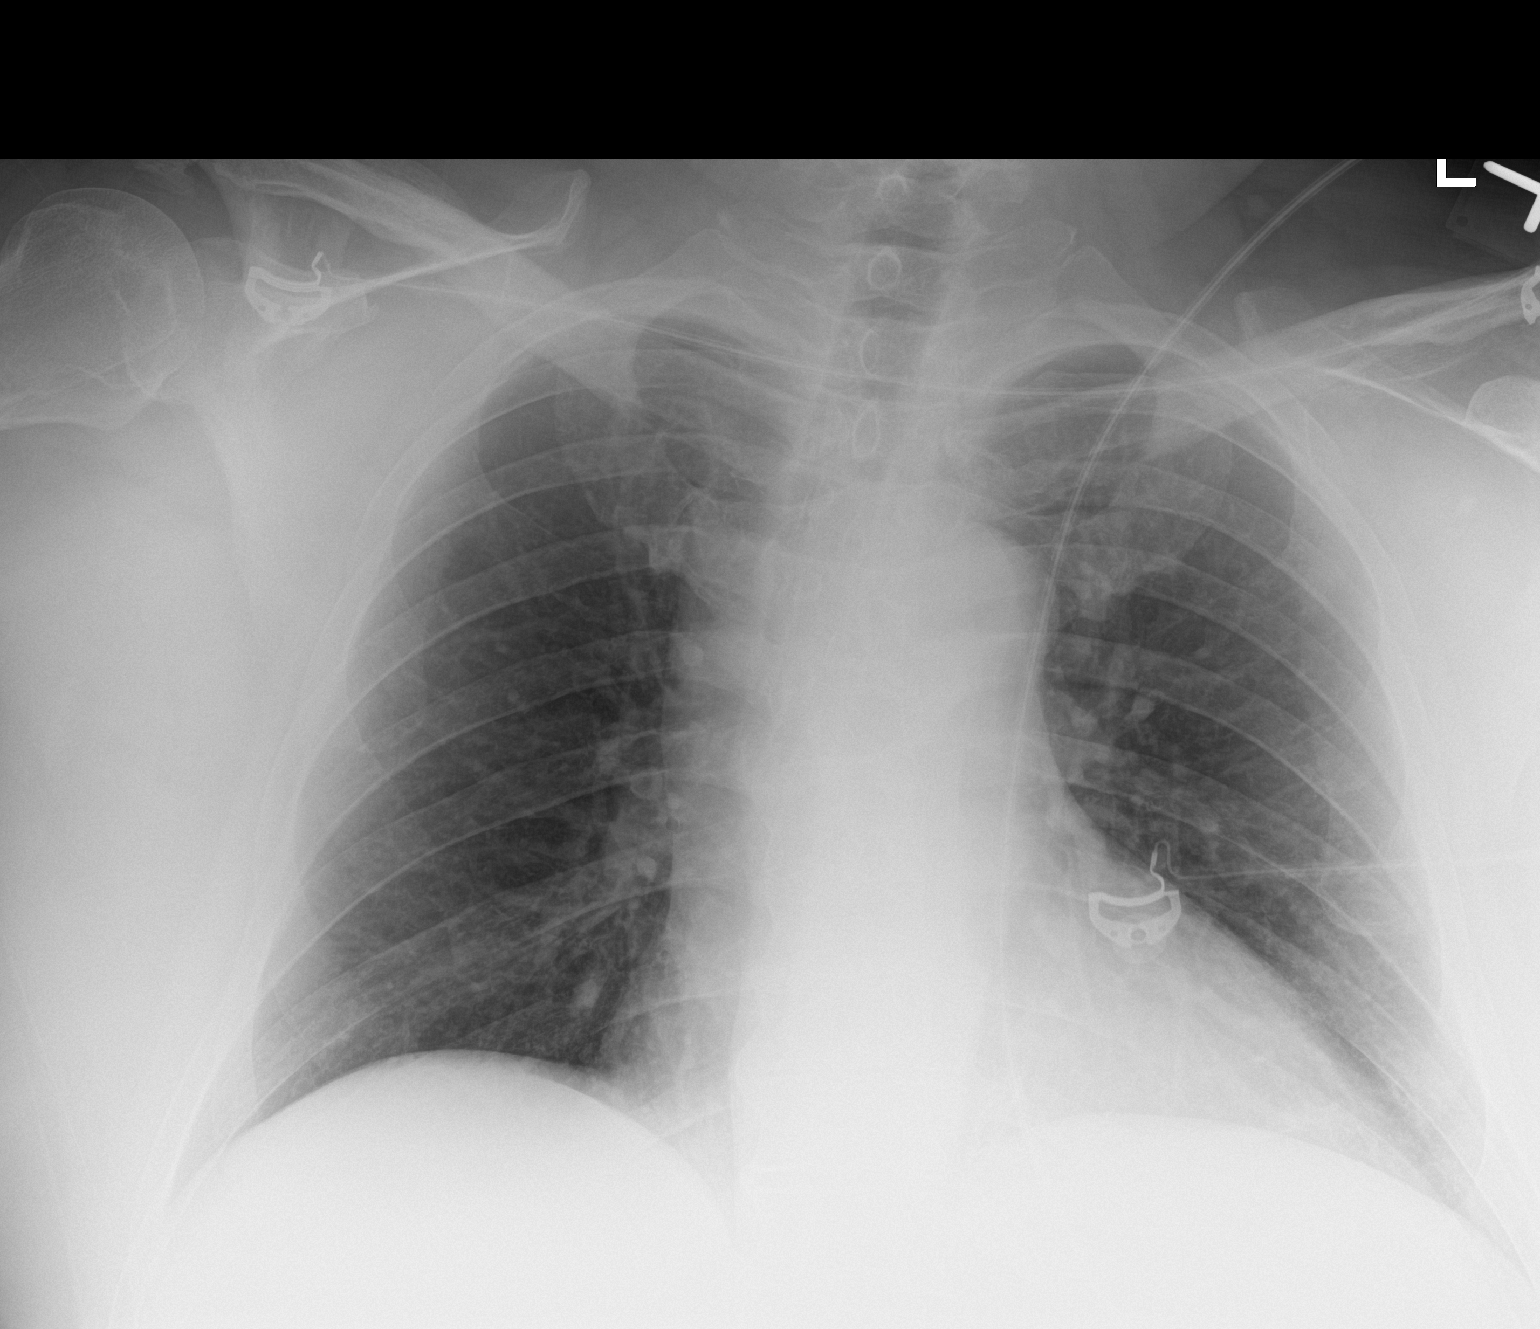

[1 of 1 positions shown; findings below may reference images not displayed]

FINDINGS: No acute consolidation or effusion. Borderline to mild cardiomegaly.
Negative for pneumothorax.
IMPRESSION: Borderline to mild cardiomegaly.  Negative for edema or infiltrate.

## 2018-10-23 IMAGING — MR MR HEAD W/O CM
9 of 11 series · 29 of 48 positions shown · non-contrast
Comparison: None.

Antain Zita noncontrast CT head and CTA head and neck
08/11/2017.

CLINICAL DATA: 55-year-old male with sudden onset right upper and
lower extremity weakness. Headache and word finding difficulty.
Status post IV tPA.

EXAM:
MRI HEAD WITHOUT CONTRAST
MRA HEAD WITHOUT CONTRAST
TECHNIQUE: Multiplanar, multiecho pulse sequences of the brain and surrounding
structures were obtained without intravenous contrast. Angiographic
images of the head were obtained using MRA technique without
contrast.

[Series 3: DWI · axial · 3.0mm · 0.94mm/px · z∈[-32,+121]mm · 7 of 108 slices shown (1 of 2)]
[im 1/108]
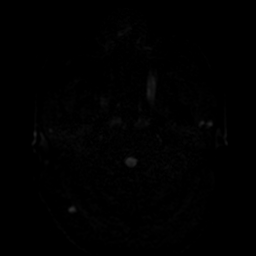
[im 18/108]
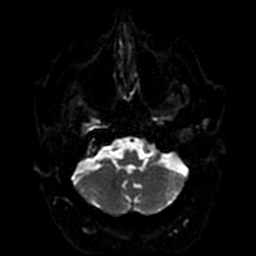
[im 36/108]
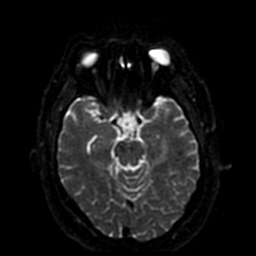
[im 54/108]
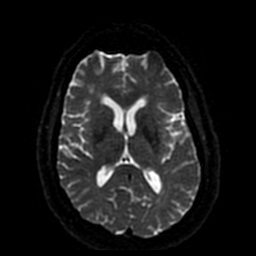
[im 72/108]
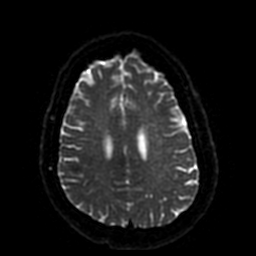
[im 90/108]
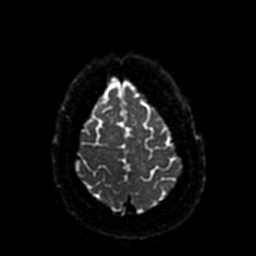
[im 108/108]
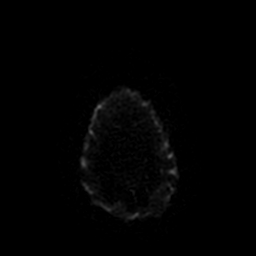

[Series 4: ax (id) 2 · axial · 1.0mm · 0.43mm/px · z∈[-27,+20]mm · 5 of 184 slices shown]
[im 1/184]
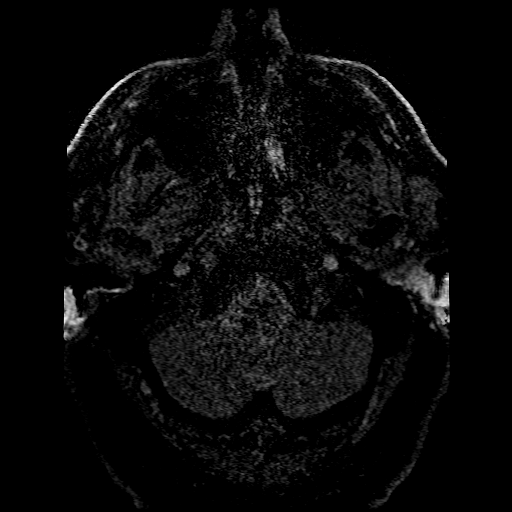
[im 34/184]
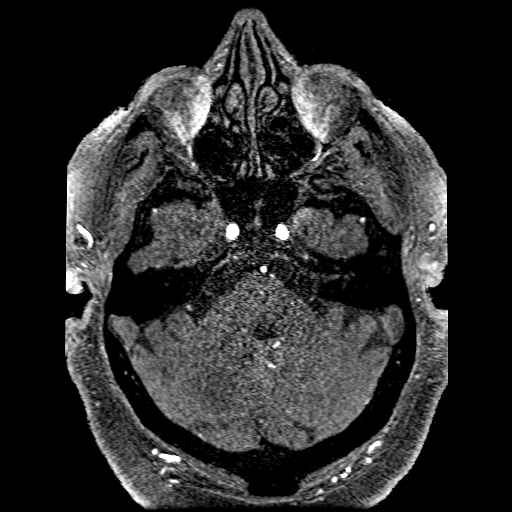
[im 50/184]
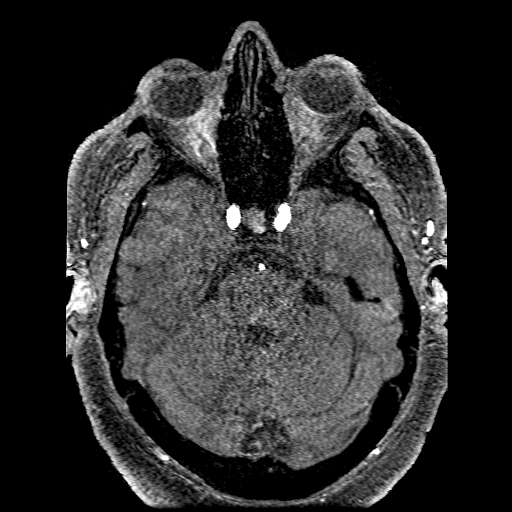
[im 84/184]
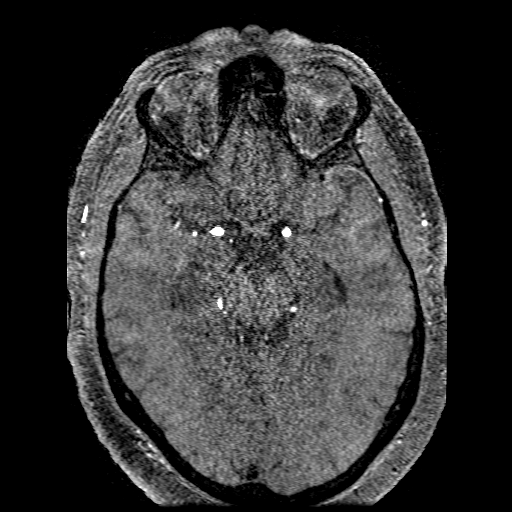
[im 100/184]
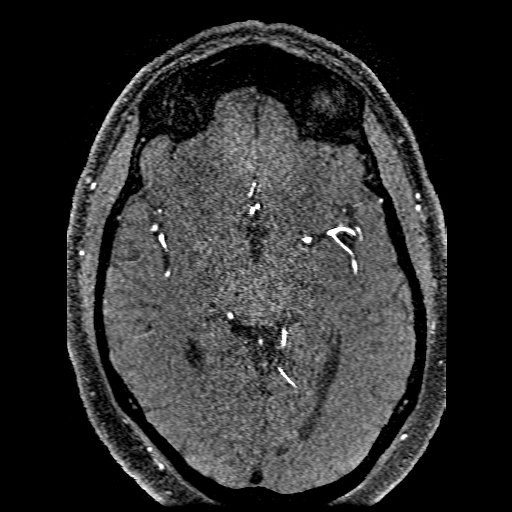

[Series 5: DWI · coronal · 4.0mm · 0.94mm/px · 5 of 72 slices shown (2 of 2)]
[im 1/72]
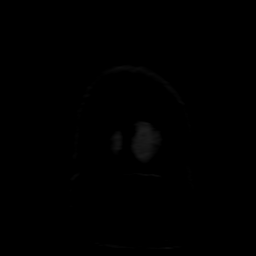
[im 18/72]
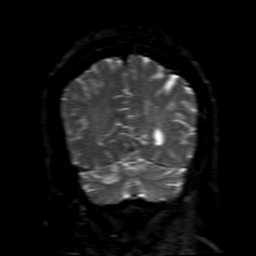
[im 36/72]
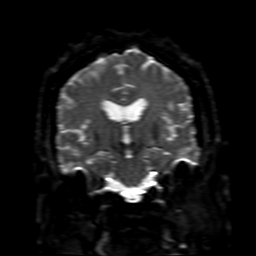
[im 54/72]
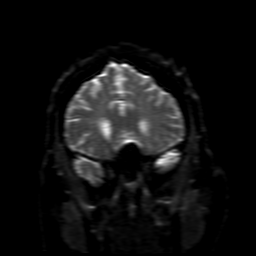
[im 72/72]
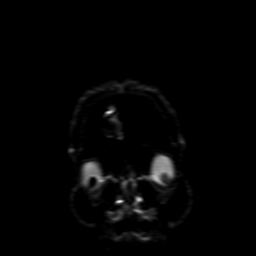

[Series 6: FLAIR · sagittal · 5.0mm · 0.49mm/px · 2 of 25 slices shown (1 of 2)]
[im 1/25]
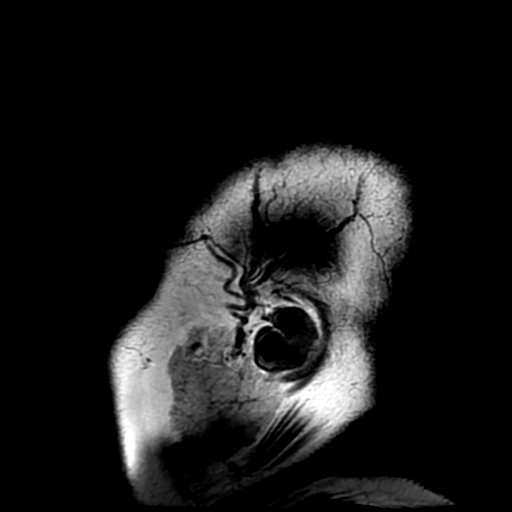
[im 25/25]
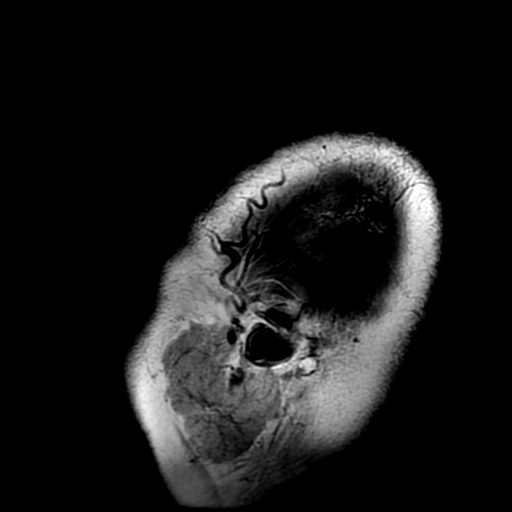

[Series 8: T2 · axial · 5.0mm · 0.49mm/px · z∈[-46,+117]mm · 2 of 29 slices shown (1 of 2)]
[im 1/29]
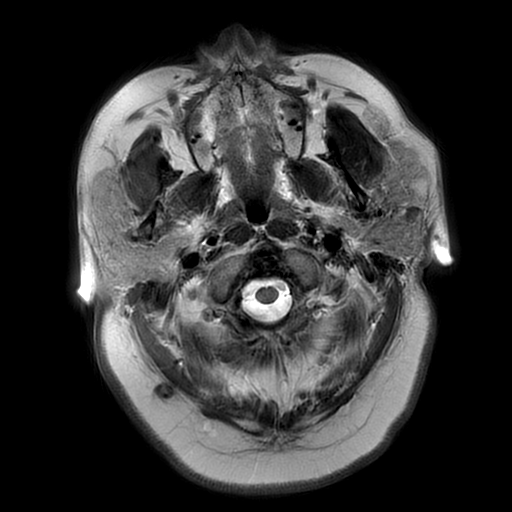
[im 29/29]
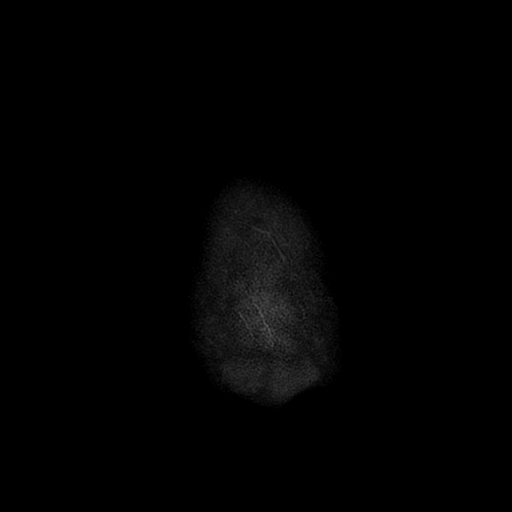

[Series 9: FLAIR · axial · 5.0mm · 0.49mm/px · z∈[-46,+117]mm · 2 of 29 slices shown (2 of 2)]
[im 1/29]
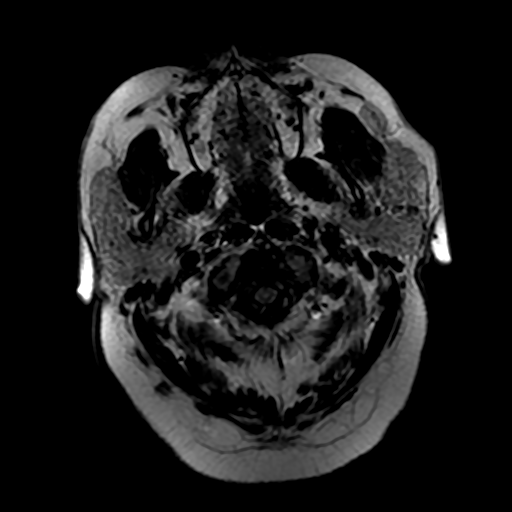
[im 29/29]
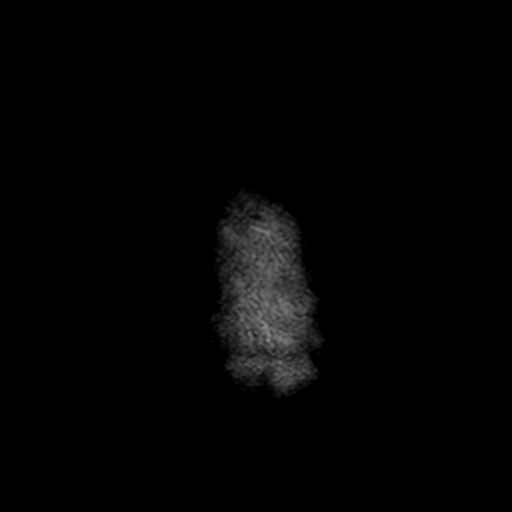

[Series 12: T2 · coronal · 5.0mm · 0.41mm/px · 1 of 16 slices shown (2 of 2)]
[im 1/16]
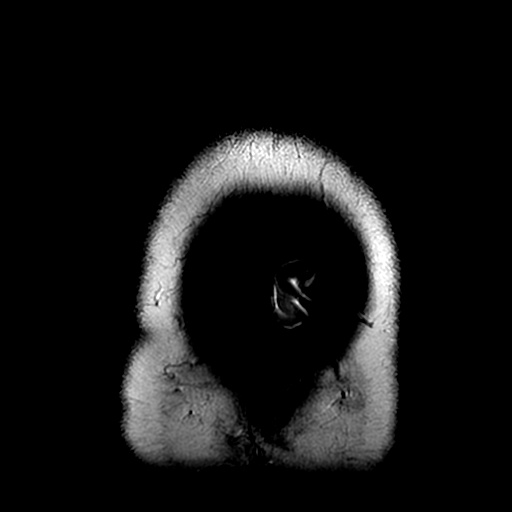

[Series 350: ADC · axial · 3.0mm · 0.94mm/px · z∈[-32,+121]mm · 3 of 52 slices shown (1 of 2)]
[im 1/52]
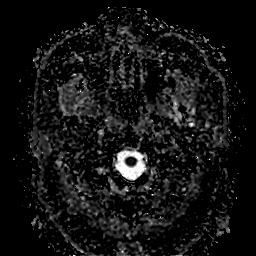
[im 26/52]
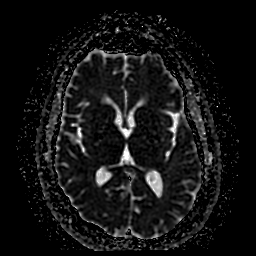
[im 52/52]
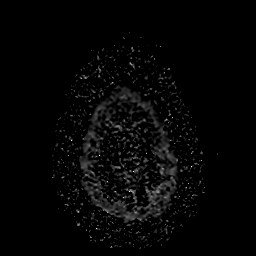

[Series 550: ADC · coronal · 4.0mm · 0.94mm/px · 2 of 36 slices shown (2 of 2)]
[im 1/36]
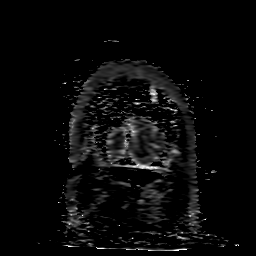
[im 36/36]
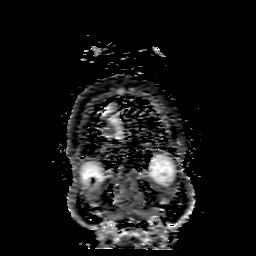

[29 of 48 positions shown; findings below may reference images not displayed]

FINDINGS: MRI HEAD FINDINGS

Brain: Patchy 4 cm area of restricted diffusion scattered in the
right superior and mid cerebellar hemisphere. Associated T2 and
FLAIR hyperintensity, and T1 hypointensity with minimal petechial
hemorrhage (series 10, image 25). No associated mass effect.

No other restricted diffusion. No midline shift, mass effect,
evidence of mass lesion, ventriculomegaly, extra-axial collection or
malignant intracranial hemorrhage. Cervicomedullary junction and
pituitary are within normal limits.

Scattered and occasionally patchy bilateral cerebral white matter T2
and FLAIR hyperintensity is in a nonspecific configuration. No
cortical encephalomalacia or other cerebral blood products are
identified. The bilateral deep gray matter nuclei, brainstem, and
left cerebellar hemisphere appear normal.

Vascular: Major intracranial vascular flow voids are preserved.

Skull and upper cervical spine: Negative. Normal bone marrow signal.

Sinuses/Orbits: Normal orbits soft tissues.

Visualized paranasal sinuses and mastoids are stable and well
pneumatized.

Other: Visible internal auditory structures appear normal. Scalp and
face soft tissues appear negative.

MRA HEAD FINDINGS

The distal vertebral arteries are not included on these images but
were patent on the CTA yesterday. The right appeared mildly
dominant. Patent vertebrobasilar junction. The basilar artery is
irregular suggesting atherosclerosis, but patent without stenosis.
Possible decreased flow in the right AICA origin (series 4 image
156). Left AICA appears patent. Both SCA is are patent (the left
side is duplicated). There are fetal type bilateral PCA origins.
There is mild left PCA P2 segment irregularity and stenosis, but
otherwise the bilateral PCA branches appear normal.

Antegrade flow in both ICA siphons. No siphon stenosis. Normal
ophthalmic and posterior communicating artery origins. Normal
carotid termini, MCA and ACA origins. Anterior communicating artery
is diminutive. Visible ACA branches are normal. Visible bilateral
MCA branches are normal, the left MCA has an early bifurcation.
IMPRESSION: 1. Patchy acute infarcts in the right cerebellum, right SCA/AICA
territory.
2. Trace petechial hemorrhage (Heidelberg Classification 1a) and
mild cerebellar edema without mass effect.
3. Evidence of Basilar Artery atherosclerosis, and decreased flow at
the Right AICA origin. No significant basilar stenosis.
4. Negative anterior circulation. Fetal type bilateral PCA origins
with mild left P2 segment irregularity and stenosis.
5. Moderate for age nonspecific cerebral white matter signal
changes, favor small vessel disease related.

## 2018-11-05 LAB — CUP PACEART REMOTE DEVICE CHECK
Date Time Interrogation Session: 20191130233702
MDC IDC PG IMPLANT DT: 20181009

## 2018-11-11 ENCOUNTER — Ambulatory Visit (INDEPENDENT_AMBULATORY_CARE_PROVIDER_SITE_OTHER): Payer: 59

## 2018-11-11 DIAGNOSIS — I639 Cerebral infarction, unspecified: Secondary | ICD-10-CM | POA: Diagnosis not present

## 2018-11-12 LAB — CUP PACEART REMOTE DEVICE CHECK
Date Time Interrogation Session: 20200102233721
Implantable Pulse Generator Implant Date: 20181009

## 2018-11-12 NOTE — Progress Notes (Signed)
Carelink Summary Report / Loop Recorder 

## 2018-12-14 ENCOUNTER — Ambulatory Visit (INDEPENDENT_AMBULATORY_CARE_PROVIDER_SITE_OTHER): Payer: 59

## 2018-12-14 DIAGNOSIS — I639 Cerebral infarction, unspecified: Secondary | ICD-10-CM

## 2018-12-16 LAB — CUP PACEART REMOTE DEVICE CHECK
Implantable Pulse Generator Implant Date: 20181009
MDC IDC SESS DTM: 20200204233923

## 2018-12-22 NOTE — Progress Notes (Signed)
Carelink Summary Report / Loop Recorder 

## 2019-01-17 ENCOUNTER — Ambulatory Visit (INDEPENDENT_AMBULATORY_CARE_PROVIDER_SITE_OTHER): Payer: 59 | Admitting: *Deleted

## 2019-01-17 DIAGNOSIS — I639 Cerebral infarction, unspecified: Secondary | ICD-10-CM | POA: Diagnosis not present

## 2019-01-18 LAB — CUP PACEART REMOTE DEVICE CHECK
Implantable Pulse Generator Implant Date: 20181009
MDC IDC SESS DTM: 20200309000940

## 2019-01-24 NOTE — Progress Notes (Signed)
Carelink Summary Report / Loop Recorder 

## 2019-01-28 DIAGNOSIS — E785 Hyperlipidemia, unspecified: Secondary | ICD-10-CM | POA: Diagnosis not present

## 2019-01-28 DIAGNOSIS — I10 Essential (primary) hypertension: Secondary | ICD-10-CM | POA: Diagnosis not present

## 2019-01-28 DIAGNOSIS — Z6838 Body mass index (BMI) 38.0-38.9, adult: Secondary | ICD-10-CM | POA: Diagnosis not present

## 2019-01-28 DIAGNOSIS — Z79899 Other long term (current) drug therapy: Secondary | ICD-10-CM | POA: Diagnosis not present

## 2019-02-18 ENCOUNTER — Ambulatory Visit (INDEPENDENT_AMBULATORY_CARE_PROVIDER_SITE_OTHER): Payer: 59 | Admitting: *Deleted

## 2019-02-18 ENCOUNTER — Other Ambulatory Visit: Payer: Self-pay

## 2019-02-18 DIAGNOSIS — I639 Cerebral infarction, unspecified: Secondary | ICD-10-CM

## 2019-02-19 LAB — CUP PACEART REMOTE DEVICE CHECK
Date Time Interrogation Session: 20200411003808
Implantable Pulse Generator Implant Date: 20181009

## 2019-02-25 NOTE — Progress Notes (Signed)
Carelink Summary Report / Loop Recorder 

## 2019-03-23 ENCOUNTER — Other Ambulatory Visit: Payer: Self-pay

## 2019-03-23 ENCOUNTER — Ambulatory Visit (INDEPENDENT_AMBULATORY_CARE_PROVIDER_SITE_OTHER): Payer: 59 | Admitting: *Deleted

## 2019-03-23 DIAGNOSIS — I639 Cerebral infarction, unspecified: Secondary | ICD-10-CM

## 2019-03-24 LAB — CUP PACEART REMOTE DEVICE CHECK
Date Time Interrogation Session: 20200514014024
Implantable Pulse Generator Implant Date: 20181009

## 2019-03-28 DIAGNOSIS — I10 Essential (primary) hypertension: Secondary | ICD-10-CM | POA: Diagnosis not present

## 2019-03-28 DIAGNOSIS — Z6838 Body mass index (BMI) 38.0-38.9, adult: Secondary | ICD-10-CM | POA: Diagnosis not present

## 2019-03-28 DIAGNOSIS — J329 Chronic sinusitis, unspecified: Secondary | ICD-10-CM | POA: Diagnosis not present

## 2019-03-28 DIAGNOSIS — J40 Bronchitis, not specified as acute or chronic: Secondary | ICD-10-CM | POA: Diagnosis not present

## 2019-04-08 NOTE — Progress Notes (Signed)
Carelink Summary Report / Loop Recorder 

## 2019-04-25 ENCOUNTER — Ambulatory Visit (INDEPENDENT_AMBULATORY_CARE_PROVIDER_SITE_OTHER): Payer: 59 | Admitting: *Deleted

## 2019-04-25 DIAGNOSIS — I639 Cerebral infarction, unspecified: Secondary | ICD-10-CM

## 2019-04-26 LAB — CUP PACEART REMOTE DEVICE CHECK
Date Time Interrogation Session: 20200616020925
Implantable Pulse Generator Implant Date: 20181009

## 2019-05-01 NOTE — Progress Notes (Signed)
Carelink Summary Report / Loop Recorder 

## 2019-05-30 ENCOUNTER — Ambulatory Visit (INDEPENDENT_AMBULATORY_CARE_PROVIDER_SITE_OTHER): Payer: 59 | Admitting: *Deleted

## 2019-05-30 DIAGNOSIS — I639 Cerebral infarction, unspecified: Secondary | ICD-10-CM | POA: Diagnosis not present

## 2019-05-30 LAB — CUP PACEART REMOTE DEVICE CHECK
Date Time Interrogation Session: 20200719021050
Implantable Pulse Generator Implant Date: 20181009

## 2019-06-13 NOTE — Progress Notes (Signed)
Carelink Summary Report / Loop Recorder 

## 2019-06-30 ENCOUNTER — Ambulatory Visit (INDEPENDENT_AMBULATORY_CARE_PROVIDER_SITE_OTHER): Payer: 59 | Admitting: *Deleted

## 2019-06-30 DIAGNOSIS — I6300 Cerebral infarction due to thrombosis of unspecified precerebral artery: Secondary | ICD-10-CM

## 2019-07-03 LAB — CUP PACEART REMOTE DEVICE CHECK
Date Time Interrogation Session: 20200821023544
Implantable Pulse Generator Implant Date: 20181009

## 2019-07-07 NOTE — Progress Notes (Signed)
Carelink Summary Report / Loop Recorder 

## 2019-08-02 ENCOUNTER — Ambulatory Visit (INDEPENDENT_AMBULATORY_CARE_PROVIDER_SITE_OTHER): Payer: 59 | Admitting: *Deleted

## 2019-08-02 DIAGNOSIS — I6300 Cerebral infarction due to thrombosis of unspecified precerebral artery: Secondary | ICD-10-CM | POA: Diagnosis not present

## 2019-08-03 LAB — CUP PACEART REMOTE DEVICE CHECK
Date Time Interrogation Session: 20200923023752
Implantable Pulse Generator Implant Date: 20181009

## 2019-08-09 NOTE — Progress Notes (Signed)
Carelink Summary Report / Loop Recorder 

## 2019-09-05 ENCOUNTER — Ambulatory Visit (INDEPENDENT_AMBULATORY_CARE_PROVIDER_SITE_OTHER): Payer: 59 | Admitting: *Deleted

## 2019-09-05 DIAGNOSIS — I6300 Cerebral infarction due to thrombosis of unspecified precerebral artery: Secondary | ICD-10-CM

## 2019-09-06 LAB — CUP PACEART REMOTE DEVICE CHECK
Date Time Interrogation Session: 20201026023926
Implantable Pulse Generator Implant Date: 20181009

## 2019-09-22 NOTE — Progress Notes (Signed)
Carelink Summary Report / Loop Recorder 

## 2019-10-09 LAB — CUP PACEART REMOTE DEVICE CHECK
Date Time Interrogation Session: 20201127214302
Implantable Pulse Generator Implant Date: 20181009

## 2019-10-10 ENCOUNTER — Ambulatory Visit (INDEPENDENT_AMBULATORY_CARE_PROVIDER_SITE_OTHER): Payer: 59 | Admitting: *Deleted

## 2019-10-10 DIAGNOSIS — I6300 Cerebral infarction due to thrombosis of unspecified precerebral artery: Secondary | ICD-10-CM

## 2019-11-02 NOTE — Progress Notes (Signed)
ILR remote 

## 2019-11-10 ENCOUNTER — Ambulatory Visit (INDEPENDENT_AMBULATORY_CARE_PROVIDER_SITE_OTHER): Payer: 59 | Admitting: *Deleted

## 2019-11-10 DIAGNOSIS — I6300 Cerebral infarction due to thrombosis of unspecified precerebral artery: Secondary | ICD-10-CM

## 2019-11-10 LAB — CUP PACEART REMOTE DEVICE CHECK
Date Time Interrogation Session: 20201230231847
Implantable Pulse Generator Implant Date: 20181009

## 2019-12-12 ENCOUNTER — Ambulatory Visit (INDEPENDENT_AMBULATORY_CARE_PROVIDER_SITE_OTHER): Payer: 59 | Admitting: *Deleted

## 2019-12-12 DIAGNOSIS — I6300 Cerebral infarction due to thrombosis of unspecified precerebral artery: Secondary | ICD-10-CM

## 2019-12-12 LAB — CUP PACEART REMOTE DEVICE CHECK
Date Time Interrogation Session: 20210201002744
Implantable Pulse Generator Implant Date: 20181009

## 2019-12-12 NOTE — Progress Notes (Signed)
ILR Remote 

## 2020-01-12 ENCOUNTER — Ambulatory Visit (INDEPENDENT_AMBULATORY_CARE_PROVIDER_SITE_OTHER): Payer: 59 | Admitting: *Deleted

## 2020-01-12 DIAGNOSIS — I6300 Cerebral infarction due to thrombosis of unspecified precerebral artery: Secondary | ICD-10-CM | POA: Diagnosis not present

## 2020-01-12 LAB — CUP PACEART REMOTE DEVICE CHECK
Date Time Interrogation Session: 20210304030906
Implantable Pulse Generator Implant Date: 20181009

## 2020-01-12 NOTE — Progress Notes (Signed)
ILR Remote 

## 2020-02-13 ENCOUNTER — Ambulatory Visit (INDEPENDENT_AMBULATORY_CARE_PROVIDER_SITE_OTHER): Payer: 59 | Admitting: *Deleted

## 2020-02-13 DIAGNOSIS — I6300 Cerebral infarction due to thrombosis of unspecified precerebral artery: Secondary | ICD-10-CM | POA: Diagnosis not present

## 2020-02-13 LAB — CUP PACEART REMOTE DEVICE CHECK
Date Time Interrogation Session: 20210404041223
Implantable Pulse Generator Implant Date: 20181009

## 2020-02-14 NOTE — Progress Notes (Signed)
ILR Remote 

## 2020-03-15 LAB — CUP PACEART REMOTE DEVICE CHECK
Date Time Interrogation Session: 20210505041608
Implantable Pulse Generator Implant Date: 20181009

## 2020-03-19 ENCOUNTER — Ambulatory Visit (INDEPENDENT_AMBULATORY_CARE_PROVIDER_SITE_OTHER): Payer: 59 | Admitting: *Deleted

## 2020-03-19 DIAGNOSIS — I6300 Cerebral infarction due to thrombosis of unspecified precerebral artery: Secondary | ICD-10-CM | POA: Diagnosis not present

## 2020-03-20 NOTE — Progress Notes (Signed)
Carelink Summary Report / Loop Recorder 

## 2020-04-23 ENCOUNTER — Ambulatory Visit (INDEPENDENT_AMBULATORY_CARE_PROVIDER_SITE_OTHER): Payer: 59 | Admitting: *Deleted

## 2020-04-23 DIAGNOSIS — I6300 Cerebral infarction due to thrombosis of unspecified precerebral artery: Secondary | ICD-10-CM

## 2020-04-23 LAB — CUP PACEART REMOTE DEVICE CHECK
Date Time Interrogation Session: 20210613234106
Implantable Pulse Generator Implant Date: 20181009

## 2020-04-23 NOTE — Progress Notes (Signed)
Carelink Summary Report / Loop Recorder 

## 2020-05-28 ENCOUNTER — Ambulatory Visit (INDEPENDENT_AMBULATORY_CARE_PROVIDER_SITE_OTHER): Payer: 59 | Admitting: *Deleted

## 2020-05-28 DIAGNOSIS — I6300 Cerebral infarction due to thrombosis of unspecified precerebral artery: Secondary | ICD-10-CM

## 2020-05-28 LAB — CUP PACEART REMOTE DEVICE CHECK
Date Time Interrogation Session: 20210718230625
Implantable Pulse Generator Implant Date: 20181009

## 2020-05-29 NOTE — Progress Notes (Signed)
Carelink Summary Report / Loop Recorder 

## 2020-06-23 ENCOUNTER — Other Ambulatory Visit: Payer: Self-pay

## 2020-06-23 ENCOUNTER — Ambulatory Visit (HOSPITAL_COMMUNITY)
Admission: EM | Admit: 2020-06-23 | Discharge: 2020-06-23 | Disposition: A | Discharge status: Home / Self Care | Payer: 59 | Attending: Emergency Medicine | Admitting: Emergency Medicine

## 2020-06-23 ENCOUNTER — Emergency Department (HOSPITAL_COMMUNITY): Payer: 59 | Admitting: Certified Registered Nurse Anesthetist

## 2020-06-23 ENCOUNTER — Encounter (HOSPITAL_COMMUNITY)
Admission: EM | Disposition: A | Discharge status: Home / Self Care | Payer: Self-pay | Source: Home / Self Care | Attending: Emergency Medicine

## 2020-06-23 ENCOUNTER — Encounter (HOSPITAL_COMMUNITY): Payer: Self-pay | Admitting: Emergency Medicine

## 2020-06-23 ENCOUNTER — Emergency Department (HOSPITAL_COMMUNITY): Payer: 59

## 2020-06-23 DIAGNOSIS — Z7982 Long term (current) use of aspirin: Secondary | ICD-10-CM | POA: Diagnosis not present

## 2020-06-23 DIAGNOSIS — K222 Esophageal obstruction: Secondary | ICD-10-CM | POA: Diagnosis not present

## 2020-06-23 DIAGNOSIS — R1314 Dysphagia, pharyngoesophageal phase: Secondary | ICD-10-CM | POA: Diagnosis not present

## 2020-06-23 DIAGNOSIS — X58XXXA Exposure to other specified factors, initial encounter: Secondary | ICD-10-CM | POA: Diagnosis not present

## 2020-06-23 DIAGNOSIS — Z20822 Contact with and (suspected) exposure to covid-19: Secondary | ICD-10-CM | POA: Diagnosis not present

## 2020-06-23 DIAGNOSIS — G4733 Obstructive sleep apnea (adult) (pediatric): Secondary | ICD-10-CM | POA: Diagnosis not present

## 2020-06-23 DIAGNOSIS — T18128A Food in esophagus causing other injury, initial encounter: Secondary | ICD-10-CM | POA: Insufficient documentation

## 2020-06-23 DIAGNOSIS — E785 Hyperlipidemia, unspecified: Secondary | ICD-10-CM | POA: Insufficient documentation

## 2020-06-23 DIAGNOSIS — K449 Diaphragmatic hernia without obstruction or gangrene: Secondary | ICD-10-CM | POA: Diagnosis not present

## 2020-06-23 DIAGNOSIS — Z8673 Personal history of transient ischemic attack (TIA), and cerebral infarction without residual deficits: Secondary | ICD-10-CM | POA: Diagnosis not present

## 2020-06-23 DIAGNOSIS — K2100 Gastro-esophageal reflux disease with esophagitis, without bleeding: Secondary | ICD-10-CM | POA: Diagnosis not present

## 2020-06-23 DIAGNOSIS — K317 Polyp of stomach and duodenum: Secondary | ICD-10-CM | POA: Diagnosis not present

## 2020-06-23 DIAGNOSIS — Z79899 Other long term (current) drug therapy: Secondary | ICD-10-CM | POA: Diagnosis not present

## 2020-06-23 DIAGNOSIS — I10 Essential (primary) hypertension: Secondary | ICD-10-CM | POA: Insufficient documentation

## 2020-06-23 HISTORY — PX: ESOPHAGOGASTRODUODENOSCOPY (EGD) WITH PROPOFOL: SHX5813

## 2020-06-23 HISTORY — PX: BIOPSY: SHX5522

## 2020-06-23 LAB — BASIC METABOLIC PANEL
Anion gap: 13 (ref 5–15)
BUN: 17 mg/dL (ref 6–20)
CO2: 26 mmol/L (ref 22–32)
Calcium: 10 mg/dL (ref 8.9–10.3)
Chloride: 101 mmol/L (ref 98–111)
Creatinine, Ser: 1.05 mg/dL (ref 0.61–1.24)
GFR calc Af Amer: >60 mL/min (ref >60)
GFR calc non Af Amer: >60 mL/min (ref >60)
Glucose, Bld: 103 mg/dL — ABNORMAL HIGH (ref 70–99)
Potassium: 3.4 mmol/L — ABNORMAL LOW (ref 3.5–5.1)
Sodium: 140 mmol/L (ref 135–145)

## 2020-06-23 LAB — CBC
HCT: 47.2 % (ref 39.0–52.0)
Hemoglobin: 15.1 g/dL (ref 13.0–17.0)
MCH: 29.6 pg (ref 26.0–34.0)
MCHC: 32 g/dL (ref 30.0–36.0)
MCV: 92.5 fL (ref 80.0–100.0)
Platelets: 251 10*3/uL (ref 150–400)
RBC: 5.1 MIL/uL (ref 4.22–5.81)
RDW: 13.2 % (ref 11.5–15.5)
WBC: 12.2 10*3/uL — ABNORMAL HIGH (ref 4.0–10.5)
nRBC: 0 % (ref 0.0–0.2)

## 2020-06-23 LAB — SARS CORONAVIRUS 2 BY RT PCR (HOSPITAL ORDER, PERFORMED IN ~~LOC~~ HOSPITAL LAB): SARS Coronavirus 2: NEGATIVE

## 2020-06-23 SURGERY — ESOPHAGOGASTRODUODENOSCOPY (EGD) WITH PROPOFOL
Anesthesia: General

## 2020-06-23 MED ORDER — SODIUM CHLORIDE 0.9 % IV SOLN: INTRAVENOUS | Status: DC

## 2020-06-23 MED ORDER — SUCCINYLCHOLINE CHLORIDE 200 MG/10ML IV SOSY
PREFILLED_SYRINGE | INTRAVENOUS | Status: DC | PRN
  Administered 2020-06-23: 140 mg via INTRAVENOUS

## 2020-06-23 MED ORDER — ONDANSETRON HCL 4 MG/2ML IJ SOLN
4.0000 mg | Freq: Once | INTRAMUSCULAR | Status: AC
  Administered 2020-06-23: 4 mg via INTRAVENOUS
  Filled 2020-06-23: qty 2

## 2020-06-23 MED ORDER — PANTOPRAZOLE SODIUM 40 MG IV SOLR
40.0000 mg | Freq: Once | INTRAVENOUS | Status: AC
  Administered 2020-06-23: 40 mg via INTRAVENOUS
  Filled 2020-06-23: qty 40

## 2020-06-23 MED ORDER — LABETALOL HCL 5 MG/ML IV SOLN
INTRAVENOUS | Status: DC | PRN
Start: 2020-06-23 — End: 2020-06-23
  Administered 2020-06-23: 5 mg via INTRAVENOUS

## 2020-06-23 MED ORDER — GLUCAGON HCL RDNA (DIAGNOSTIC) 1 MG IJ SOLR
1.0000 mg | Freq: Once | INTRAMUSCULAR | Status: AC
  Administered 2020-06-23: 1 mg via INTRAVENOUS
  Filled 2020-06-23: qty 1

## 2020-06-23 MED ORDER — PANTOPRAZOLE SODIUM 40 MG PO TBEC: 40.0000 mg | DELAYED_RELEASE_TABLET | Freq: Every day | ORAL | 1 refills | Status: AC

## 2020-06-23 MED ORDER — LACTATED RINGERS IV SOLN: INTRAVENOUS | Status: DC | PRN

## 2020-06-23 MED ORDER — LIDOCAINE 2% (20 MG/ML) 5 ML SYRINGE
INTRAMUSCULAR | Status: DC | PRN
  Administered 2020-06-23: 80 mg via INTRAVENOUS

## 2020-06-23 MED ORDER — ONDANSETRON HCL 4 MG/2ML IJ SOLN
INTRAMUSCULAR | Status: DC | PRN
  Administered 2020-06-23: 4 mg via INTRAVENOUS

## 2020-06-23 MED ORDER — PROPOFOL 10 MG/ML IV BOLUS
INTRAVENOUS | Status: DC | PRN
  Administered 2020-06-23: 200 mg via INTRAVENOUS

## 2020-06-23 SURGICAL SUPPLY — 14 items

## 2020-06-23 NOTE — Op Note (Signed)
[facility] Patient Name: Justin [name] Procedure Date : 06/23/2020 MRN: [id number] Attending MD: Otis Brace , MD Date of Birth: November 10, 1961 CSN: [id number] Age: 59 Admit Type: Outpatient Procedure:                Upper GI endoscopy Indications:              Esophageal dysphagia, Foreign body in the esophagus Providers:                Otis Brace, MD, Angus Seller, Theodora Blow,                            Technician Referring MD:              Medicines:                General Anesthesia, Sedation Administered by an                            Anesthesia Professional Complications:            No immediate complications. Estimated Blood Loss:     Estimated blood loss was minimal. Procedure:                Pre-Anesthesia Assessment:                           - Prior to the procedure, a History and Physical                            was performed, and patient medications and                            allergies were reviewed. The patient's tolerance of                            previous anesthesia was also reviewed. The risks                            and benefits of the procedure and the sedation                            options and risks were discussed with the patient.                            All questions were answered, and informed consent                            was obtained. Prior Anticoagulants: The patient has                            taken no previous anticoagulant or antiplatelet                            agents except for aspirin. ASA Grade Assessment:  III - A patient with severe systemic disease. After                            reviewing the risks and benefits, the patient was                            deemed in satisfactory condition to undergo the                            procedure.                           After obtaining informed consent, the endoscope was                            passed under direct  vision. Throughout the                            procedure, the patient's blood pressure, pulse, and                            oxygen saturations were monitored continuously. The                            [id number]-H190 ([id number] Olympus gastroscope was                            introduced through the mouth, and advanced to the                            second part of duodenum. The upper GI endoscopy was                            accomplished without difficulty. The patient                            tolerated the procedure well. Scope In: Scope Out: Findings:      A low-grade of narrowing Schatzki ring was found in the distal esophagus.      Non-severe esophagitis with no bleeding was found in the distal       esophagus.      A small hiatal hernia was present.      A small amount of food (residue) was found in the prepyloric region of       the stomach.      A few small sessile polyps were found in the gastric fundus and in the       gastric body. Biopsies were taken with a cold forceps for histology.      The cardia and gastric fundus were normal on retroflexion.      The duodenal bulb, first portion of the duodenum and second portion of       the duodenum were normal. Impression:               - Low-grade of narrowing Schatzki ring.                           -  Non-severe reflux esophagitis with no bleeding.                           - Small hiatal hernia.                           - A small amount of food (residue) in the stomach.                           - A few gastric polyps. Biopsied.                           - Normal duodenal bulb, first portion of the                            duodenum and second portion of the duodenum. Recommendation:           - Patient has a contact number available for                            emergencies. The signs and symptoms of potential                            delayed complications were discussed with the                            patient.  Return to normal activities tomorrow.                            Written discharge instructions were provided to the                            patient.                           - Soft diet.                           - Continue present medications.                           - Await pathology results.                           - Repeat upper endoscopy in 2 months for dilation.                           - Use Protonix (pantoprazole) 40 mg PO daily.                           - Return to GI clinic in 6 weeks. Procedure Code(s):        --- Professional ---                           [phone], Esophagogastroduodenoscopy, flexible,  transoral; with biopsy, single or multiple Diagnosis Code(s):        --- Professional ---                           K22.2, Esophageal obstruction                           K21.00, Gastro-esophageal reflux disease with                            esophagitis, without bleeding                           K44.9, Diaphragmatic hernia without obstruction or                            gangrene                           K31.7, Polyp of stomach and duodenum                           R13.14, Dysphagia, pharyngoesophageal phase                           T18.108A, Unspecified foreign body in esophagus                            causing other injury, initial encounter CPT copyright 2019 [employer] Association. All rights reserved. The codes documented in this report are preliminary and upon coder review may  be revised to meet current compliance requirements. Otis Brace, MD Otis Brace, MD 06/23/2020 9:47:48 PM Number of Addenda: 0

## 2020-06-23 NOTE — ED Notes (Signed)
Pt spitting up mucus into basin and burping.  Denies nausea.

## 2020-06-23 NOTE — Transfer of Care (Signed)
Immediate Anesthesia Transfer of Care Note  Patient: Justin [name]  Procedure(s) Performed: ESOPHAGOGASTRODUODENOSCOPY (EGD) WITH PROPOFOL (N/A ) BIOPSY  Patient Location: PACU  Anesthesia Type:General  Level of Consciousness: awake, alert , oriented and patient cooperative  Airway & Oxygen Therapy: Patient Spontanous Breathing and Patient connected to face mask oxygen  Post-op Assessment: Report given to RN and Post -op Vital signs reviewed and stable  Post vital signs: Reviewed and stable  Last Vitals:  Vitals Value Taken Time  BP 132/79 06/23/20 2154  Temp    Pulse 76 06/23/20 2156  Resp 15 06/23/20 2156  SpO2 95 % 06/23/20 2156  Vitals shown include unvalidated device data.  Last Pain:  Vitals:   06/23/20 2109  TempSrc: Oral  PainSc: 0-No pain         Complications: No complications documented.

## 2020-06-23 NOTE — Discharge Instructions (Signed)
Discharge and follow-up as per [facility] gastroenterology.

## 2020-06-23 NOTE — ED Provider Notes (Signed)
[facility] ENDOSCOPY Provider Note   CSN: [id number] Arrival date & time: 06/23/20  1058     History Chief Complaint  Patient presents with  . food impaction    Justin [name] is a 59 y.o. male.  Patient was eating steak around 12 noon.  And got stuck in the throat area.  He feels like it is in the upper sternal area.  Has had this happen before but usually can drink tea and clear it up.  But this time it will not go down.  When he drinks any kind of liquids they come back up.  Saliva is going down okay now.  It was not earlier.  Patient's wife said that he has had some trouble like this ever since he had a stroke about 3 years ago.  Denies any chest pain or any shortness of breath.        Past Medical History:  Diagnosis Date  . Hypertension   . Stroke [facility])     Patient Active Problem List   Diagnosis Date Noted  . Hypertension 01/04/2018  . Hyperlipidemia 01/04/2018  . Obstructive sleep apnea 01/04/2018  . 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 LOOP RECORDER INSERTION N/A 08/18/2017   Procedure: LOOP RECORDER INSERTION;  Surgeon: [name], MD;  Location: [place] CV LAB;  Service: Cardiovascular;  Laterality: N/A;  . TEE WITHOUT CARDIOVERSION N/A 08/18/2017   Procedure: TRANSESOPHAGEAL ECHOCARDIOGRAM (TEE);  Surgeon: Pixie Casino, MD;  Location: [facility] ENDOSCOPY;  Service: Cardiovascular;  Laterality: N/A;       Family History  Problem Relation Age of Onset  . Stroke Mother   . Hypertension Mother   . Stroke Sister   . Hypertension Sister     Social History   Tobacco Use  . Smoking status: Never Smoker  . Smokeless tobacco: Never Used  Vaping Use  . Vaping Use: Never used  Substance Use Topics  . Alcohol use: Yes  . Drug use: No    Home Medications Prior to Admission medications   Medication Sig Start Date End Date Taking? Authorizing Provider  acetaminophen (TYLENOL) 650  MG CR tablet Take 1,300 mg by mouth every 8 (eight) hours as needed for pain.    [provider]  amLODipine (NORVASC) 10 MG tablet Take 10 mg by mouth daily. 07/09/18   [provider]  aspirin EC 81 MG EC tablet Take 1 tablet (81 mg total) by mouth daily. 08/14/17   Collier Salina, MD  atorvastatin (LIPITOR) 80 MG tablet Take 1 tablet (80 mg total) by mouth daily at 6 PM. 08/13/17   Rice, Resa Miner, MD  hydrochlorothiazide (HYDRODIURIL) 25 MG tablet Take 1 tablet (25 mg total) by mouth daily. 08/16/17   Fransico Meadow, PA-C  lisinopril (PRINIVIL,ZESTRIL) 40 MG tablet Take 40 mg by mouth daily. 07/01/18   [provider]    Allergies    Ciprofloxacin and Penicillins  Review of Systems   Review of Systems  Constitutional: Negative for chills and fever.  HENT: Positive for trouble swallowing. Negative for rhinorrhea and sore throat.   Eyes: Negative for visual disturbance.  Respiratory: Negative for cough and shortness of breath.   Cardiovascular: Negative for chest pain and leg swelling.  Gastrointestinal: Negative for abdominal pain, diarrhea, nausea and vomiting.  Genitourinary: Negative for dysuria.  Musculoskeletal: Negative for back pain and neck pain.  Skin: Negative for rash.  Neurological: Negative  for dizziness, light-headedness and headaches.  Hematological: Does not bruise/bleed easily.  Psychiatric/Behavioral: Negative for confusion.    Physical Exam Updated Vital Signs BP (!) 148/67   Pulse (!) 58   Temp 98.2 F (36.8 C) (Oral)   Resp 16   Ht 1.854 m (6\' 1" )   Wt 131.5 kg   SpO2 96%   BMI 38.26 kg/m   Physical Exam Vitals and nursing note reviewed.  Constitutional:      Appearance: Normal appearance. He is well-developed.  HENT:     Head: Normocephalic and atraumatic.  Eyes:     Extraocular Movements: Extraocular movements intact.     Conjunctiva/sclera: Conjunctivae normal.     Pupils: Pupils are equal, round, and reactive  to light.  Cardiovascular:     Rate and Rhythm: Normal rate and regular rhythm.     Heart sounds: No murmur heard.   Pulmonary:     Effort: Pulmonary effort is normal. No respiratory distress.     Breath sounds: Normal breath sounds.  Chest:     Chest wall: No tenderness.  Abdominal:     Palpations: Abdomen is soft.     Tenderness: There is no abdominal tenderness.  Musculoskeletal:        General: Normal range of motion.     Cervical back: Normal range of motion and neck supple.  Skin:    General: Skin is warm and dry.  Neurological:     General: No focal deficit present.     Mental Status: He is alert and oriented to person, place, and time.     Cranial Nerves: No cranial nerve deficit.     Sensory: No sensory deficit.     ED Results / Procedures / Treatments   Labs (all labs ordered are listed, but only abnormal results are displayed) Labs Reviewed  CBC - Abnormal; Notable for the following components:      Result Value   WBC 12.2 (*)    All other components within normal limits  BASIC METABOLIC PANEL - Abnormal; Notable for the following components:   Potassium 3.4 (*)    Glucose, Bld 103 (*)    All other components within normal limits  SARS CORONAVIRUS 2 BY RT PCR [facility] ORDER, [place] LAB)    EKG EKG Interpretation  Date/Time:  Saturday June 23 2020 18:21:33 EDT Ventricular Rate:  53 PR Interval:    QRS Duration: 108 QT Interval:  465 QTC Calculation: 437 R Axis:   -3 Text Interpretation: Sinus rhythm Anteroseptal infarct, age indeterminate Baseline wander in lead(s) V2 Confirmed by Fredia Sorrow [phone]) on 06/23/2020 6:23:59 PM   Radiology DG Chest Port 1 View  Result Date: 06/23/2020 CLINICAL DATA:  Food impaction EXAM: PORTABLE CHEST 1 VIEW COMPARISON:  August 11, 2017 FINDINGS: The heart size and mediastinal contours are within normal limits. Both lungs are clear. The visualized skeletal structures are unremarkable.  IMPRESSION: No active disease. Electronically Signed   By: Prudencio Pair M.D.   On: 06/23/2020 18:27    Procedures Procedures (including critical care time)  CRITICAL CARE Performed by: Fredia Sorrow Total critical care time: 45 minutes Critical care time was exclusive of separately billable procedures and treating other patients. Critical care was necessary to treat or prevent imminent or life-threatening deterioration. Critical care was time spent personally by me on the following activities: development of treatment plan with patient and/or surrogate as well as nursing, discussions with consultants, evaluation of patient's response to treatment,  examination of patient, obtaining history from patient or surrogate, ordering and performing treatments and interventions, ordering and review of laboratory studies, ordering and review of radiographic studies, pulse oximetry and re-evaluation of patient's condition.    Medications Ordered in ED Medications  0.9 %  sodium chloride infusion ( Intravenous New Bag/Given 06/23/20 2005)  0.9 %  sodium chloride infusion (has no administration in time range)  ondansetron (ZOFRAN) injection 4 mg (4 mg Intravenous Given 06/23/20 1846)  glucagon (human recombinant) (GLUCAGEN) injection 1 mg (1 mg Intravenous Given 06/23/20 1846)  pantoprazole (PROTONIX) injection 40 mg (40 mg Intravenous Given 06/23/20 2007)    ED Course  I have reviewed the triage vital signs and the nursing notes.  Pertinent labs & imaging results that were available during my care of the patient were reviewed by me and considered in my medical decision making (see chart for details).    MDM Rules/Calculators/A&P                          Gave patient here Protonix as well as glucagon.  Patient still not able to keep water down.  Contacted on call gastroenterology.  [facility] gastroenterology covering.  They can take patient for upper endoscopy.  Chest x-ray without any acute findings.   Labs without significant abnormalities other than a mild leukocytosis with a white blood cell count of 12.2.   Final Clinical Impression(s) / ED Diagnoses Final diagnoses:  Food impaction of esophagus, initial encounter    Rx / DC Orders ED Discharge Orders    None       Fredia Sorrow, MD 06/23/20 2142

## 2020-06-23 NOTE — Anesthesia Procedure Notes (Signed)
Procedure Name: Intubation Date/Time: 06/23/2020 9:30 PM Performed by: Shirlyn Goltz, CRNA Pre-anesthesia Checklist: Patient identified, Emergency Drugs available, Suction available and Patient being monitored Patient Re-evaluated:Patient Re-evaluated prior to induction Oxygen Delivery Method: Circle system utilized Preoxygenation: Pre-oxygenation with 100% oxygen Induction Type: IV induction, Rapid sequence and Cricoid Pressure applied Laryngoscope Size: Mac, 4 and Glidescope Grade View: Grade I Tube type: Oral Tube size: 7.0 mm Number of attempts: 1 Airway Equipment and Method: Stylet and Video-laryngoscopy Placement Confirmation: ETT inserted through vocal cords under direct vision,  positive ETCO2 and breath sounds checked- equal and bilateral Secured at: 21 cm Tube secured with: Tape Dental Injury: Teeth and Oropharynx as per pre-operative assessment

## 2020-06-23 NOTE — Consult Note (Signed)
Referring Provider: Emergency department provider Primary Care Physician:  Angelina Sheriff, MD Primary Gastroenterologist:   unassigned  Reason for Consultation: Food impaction  HPI: Justin [name] is a 59 y.o. male past medical history of stroke in October 20 18 currently on aspirin presented to the hospital with complaint of food stuck in esophagus.  Patient has been having trouble swallowing solid food particularly when he eats fast since more than 10 years.  He had few acquisitions of food getting stuck in esophagus which he was able to resolve with drinking more water.  He was eating steak yesterday and had sensation of food getting stuck in the midesophagus.  Not able to eat or drink since that time.  Able to swallow his saliva.  No previous EGD or colonoscopy.  Past Medical History:  Diagnosis Date  . Hypertension   . Stroke [facility])     Past Surgical History:  Procedure Laterality Date  . LOOP RECORDER INSERTION N/A 08/18/2017   Procedure: LOOP RECORDER INSERTION;  Surgeon: [name], MD;  Location: [place] CV LAB;  Service: Cardiovascular;  Laterality: N/A;  . TEE WITHOUT CARDIOVERSION N/A 08/18/2017   Procedure: TRANSESOPHAGEAL ECHOCARDIOGRAM (TEE);  Surgeon: Pixie Casino, MD;  Location: [facility] ENDOSCOPY;  Service: Cardiovascular;  Laterality: N/A;    Prior to Admission medications   Medication Sig Start Date End Date Taking? Authorizing Provider  acetaminophen (TYLENOL) 650 MG CR tablet Take 1,300 mg by mouth every 8 (eight) hours as needed for pain.    [provider]  amLODipine (NORVASC) 10 MG tablet Take 10 mg by mouth daily. 07/09/18   [provider]  aspirin EC 81 MG EC tablet Take 1 tablet (81 mg total) by mouth daily. 08/14/17   Collier Salina, MD  atorvastatin (LIPITOR) 80 MG tablet Take 1 tablet (80 mg total) by mouth daily at 6 PM. 08/13/17   Rice, Resa Miner, MD  hydrochlorothiazide (HYDRODIURIL) 25 MG tablet Take 1 tablet (25 mg total)  by mouth daily. 08/16/17   Fransico Meadow, PA-C  lisinopril (PRINIVIL,ZESTRIL) 40 MG tablet Take 40 mg by mouth daily. 07/01/18   [provider]    Scheduled Meds: Continuous Infusions: . sodium chloride 75 mL/hr at 06/23/20 2005  . sodium chloride     PRN Meds:.  Allergies as of 06/23/2020 - Review Complete 06/23/2020  Allergen Reaction Noted  . Ciprofloxacin Anaphylaxis and Swelling 08/11/2017  . Penicillins Anaphylaxis and Swelling 08/11/2017    Family History  Problem Relation Age of Onset  . Stroke Mother   . Hypertension Mother   . Stroke Sister   . Hypertension Sister     Social History   Socioeconomic History  . Marital status: Married    Spouse name: Not on file  . Number of children: Not on file  . Years of education: Not on file  . Highest education level: Not on file  Occupational History  . Not on file  Tobacco Use  . Smoking status: Never Smoker  . Smokeless tobacco: Never Used  Vaping Use  . Vaping Use: Never used  Substance and Sexual Activity  . Alcohol use: Yes  . Drug use: No  . Sexual activity: Not on file  Other Topics Concern  . Not on file  Social History Narrative  . Not on file   Social Determinants of Health   Financial Resource Strain:   . Difficulty of Paying Living Expenses:   Food Insecurity:   . Worried About  Running Out of Food in the Last Year:   . [place] in the Last Year:   Transportation Needs:   . Lack of Transportation (Medical):   Marland Kitchen Lack of Transportation (Non-Medical):   Physical Activity:   . Days of Exercise per Week:   . Minutes of Exercise per Session:   Stress:   . Feeling of Stress :   Social Connections:   . Frequency of Communication with Friends and Family:   . Frequency of Social Gatherings with Friends and Family:   . Attends Religious Services:   . Active Member of Clubs or Organizations:   . Attends Archivist Meetings:   Marland Kitchen Marital Status:   Intimate Partner Violence:    . Fear of Current or Ex-Partner:   . Emotionally Abused:   Marland Kitchen Physically Abused:   . Sexually Abused:     Review of Systems: All negative except as stated above in HPI.  Physical Exam: Vital signs: Vitals:   06/23/20 1652 06/23/20 2000  BP: (!) 144/68 132/74  Pulse: (!) 55 (!) 50  Resp: 20 16  Temp:    SpO2: 100% 97%     General:   Alert,  Well-developed, well-nourished, pleasant and cooperative in NAD HEENT : Normocephalic, atraumatic, extraocular movement intact.  Face mask present Lungs:  Clear throughout to auscultation.   No wheezes, crackles, or rhonchi. No acute distress. Heart:  Regular rate and rhythm; no murmurs, clicks, rubs,  or gallops. Abdomen: Soft, nontender, nondistended, bowel sounds present.  No peritoneal signs Lower extremity.  Changes of venous stasis and dry skin noted. Neuro.  Alert/oriented x3 Psych.  Mood and affect normal Rectal:  Deferred  GI:  Lab Results: Recent Labs    06/23/20 1847  WBC 12.2*  HGB 15.1  HCT 47.2  PLT 251   BMET Recent Labs    06/23/20 1847  NA 140  K 3.4*  CL 101  CO2 26  GLUCOSE 103*  BUN 17  CREATININE 1.05  CALCIUM 10.0   LFT No results for input(s): PROT, ALBUMIN, AST, ALT, ALKPHOS, BILITOT, BILIDIR, IBILI in the last 72 hours. PT/INR No results for input(s): LABPROT, INR in the last 72 hours.   Studies/Results: DG Chest Port 1 View  Result Date: 06/23/2020 CLINICAL DATA:  Food impaction EXAM: PORTABLE CHEST 1 VIEW COMPARISON:  August 11, 2017 FINDINGS: The heart size and mediastinal contours are within normal limits. Both lungs are clear. The visualized skeletal structures are unremarkable. IMPRESSION: No active disease. Electronically Signed   By: Prudencio Pair M.D.   On: 06/23/2020 18:27    Impression/Plan: Food impaction Chronic esophageal dysphagia History of stroke.  Currently on aspirin  Recommendations ------------------------ -Proceed with urgent EGD tonight. -Patient was advised to  chew food well and moist food before swallowing.   Risks (bleeding, infection, bowel perforation that could require surgery, sedation-related changes in cardiopulmonary systems), benefits (identification and possible treatment of source of symptoms, exclusion of certain causes of symptoms), and alternatives (watchful waiting, radiographic imaging studies, empiric medical treatment)  were explained to patient  in detail and patient wishes to proceed.    LOS: 0 days   Otis Brace  MD, FACP 06/23/2020, 9:08 PM  Contact #  [phone]

## 2020-06-23 NOTE — ED Triage Notes (Signed)
PT states "I have a chunk of steak caught".  States it feels like it is in center of chest.  States it happens sometimes and normally he can drink tea and clear it up.  States when he drinks water he feels the urge to belch. Handling secretions without difficulty.

## 2020-06-24 ENCOUNTER — Encounter (HOSPITAL_COMMUNITY): Payer: Self-pay | Admitting: Gastroenterology

## 2020-06-24 NOTE — Anesthesia Preprocedure Evaluation (Signed)
Anesthesia Evaluation  Patient identified by MRN, date of birth, ID band Patient awake    Reviewed: Allergy & Precautions, NPO status , Patient's Chart, lab work & pertinent test results  History of Anesthesia Complications Negative for: history of anesthetic complications  Airway Mallampati: III  TM Distance: >3 FB Neck ROM: Full    Dental  (+) Dental Advisory Given   Pulmonary sleep apnea , neg recent URI,    breath sounds clear to auscultation       Cardiovascular hypertension, Pt. on medications (-) angina(-) Past MI and (-) CHF  Rhythm:Regular     Neuro/Psych CVA, No Residual Symptoms negative psych ROS   GI/Hepatic Neg liver ROS, Food impaction   Endo/Other  negative endocrine ROS  Renal/GU negative Renal ROS     Musculoskeletal negative musculoskeletal ROS (+)   Abdominal   Peds  Hematology negative hematology ROS (+)   Anesthesia Other Findings   Reproductive/Obstetrics                             Anesthesia Physical Anesthesia Plan  ASA: II  Anesthesia Plan: General   Post-op Pain Management:    Induction: Intravenous, Rapid sequence and Cricoid pressure planned  PONV Risk Score and Plan: 2 and Ondansetron  Airway Management Planned: Oral ETT  Additional Equipment: None  Intra-op Plan:   Post-operative Plan: Extubation in OR  Informed Consent: I have reviewed the patients History and Physical, chart, labs and discussed the procedure including the risks, benefits and alternatives for the proposed anesthesia with the patient or authorized representative who has indicated his/her understanding and acceptance.     Dental advisory given  Plan Discussed with: CRNA and Surgeon  Anesthesia Plan Comments:         Anesthesia Quick Evaluation

## 2020-06-24 NOTE — Anesthesia Postprocedure Evaluation (Signed)
Anesthesia Post Note  Patient: Justin [name]  Procedure(s) Performed: ESOPHAGOGASTRODUODENOSCOPY (EGD) WITH PROPOFOL (N/A ) BIOPSY     Patient location during evaluation: PACU Anesthesia Type: General Level of consciousness: awake and alert Pain management: pain level controlled Vital Signs Assessment: post-procedure vital signs reviewed and stable Respiratory status: spontaneous breathing, nonlabored ventilation, respiratory function stable and patient connected to nasal cannula oxygen Cardiovascular status: blood pressure returned to baseline and stable Postop Assessment: no apparent nausea or vomiting Anesthetic complications: no   No complications documented.  Last Vitals:  Vitals:   06/23/20 2154 06/23/20 2207  BP: 132/79 107/71  Pulse: 80 65  Resp: (!) 23 16  Temp: 36.7 C 36.8 C  SpO2: 93% 93%    Last Pain:  Vitals:   06/23/20 2207  TempSrc:   PainSc: 0-No pain                 Crixus Mcaulay

## 2020-06-25 ENCOUNTER — Encounter (HOSPITAL_COMMUNITY): Payer: Self-pay | Admitting: Gastroenterology

## 2020-06-26 LAB — SURGICAL PATHOLOGY

## 2020-07-01 LAB — CUP PACEART REMOTE DEVICE CHECK
Date Time Interrogation Session: 20210818232729
Implantable Pulse Generator Implant Date: 20181009

## 2020-07-02 ENCOUNTER — Ambulatory Visit (INDEPENDENT_AMBULATORY_CARE_PROVIDER_SITE_OTHER): Payer: 59 | Admitting: *Deleted

## 2020-07-02 DIAGNOSIS — I6300 Cerebral infarction due to thrombosis of unspecified precerebral artery: Secondary | ICD-10-CM | POA: Diagnosis not present

## 2020-07-02 NOTE — Progress Notes (Signed)
Carelink Summary Report / Loop Recorder 

## 2020-08-05 LAB — CUP PACEART REMOTE DEVICE CHECK
Date Time Interrogation Session: 20210918232237
Implantable Pulse Generator Implant Date: 20181009

## 2020-08-06 ENCOUNTER — Ambulatory Visit (INDEPENDENT_AMBULATORY_CARE_PROVIDER_SITE_OTHER): Payer: 59 | Admitting: Emergency Medicine

## 2020-08-06 DIAGNOSIS — I6300 Cerebral infarction due to thrombosis of unspecified precerebral artery: Secondary | ICD-10-CM

## 2020-08-08 NOTE — Progress Notes (Signed)
Carelink Summary Report / Loop Recorder 

## 2020-08-29 ENCOUNTER — Ambulatory Visit (INDEPENDENT_AMBULATORY_CARE_PROVIDER_SITE_OTHER): Payer: 59

## 2020-08-29 DIAGNOSIS — I6300 Cerebral infarction due to thrombosis of unspecified precerebral artery: Secondary | ICD-10-CM

## 2020-08-30 LAB — CUP PACEART REMOTE DEVICE CHECK
Date Time Interrogation Session: 20211019233241
Implantable Pulse Generator Implant Date: 20181009

## 2020-09-04 NOTE — Progress Notes (Signed)
Carelink Summary Report / Loop Recorder 

## 2020-09-05 NOTE — Addendum Note (Signed)
Addended by: Douglass Rivers D on: 09/05/2020 03:29 PM   Modules accepted: Level of Service

## 2020-09-29 LAB — CUP PACEART REMOTE DEVICE CHECK
Date Time Interrogation Session: 20211119223611
Implantable Pulse Generator Implant Date: 20181009

## 2020-10-01 ENCOUNTER — Ambulatory Visit (INDEPENDENT_AMBULATORY_CARE_PROVIDER_SITE_OTHER): Payer: BC Managed Care – PPO

## 2020-10-01 DIAGNOSIS — I6300 Cerebral infarction due to thrombosis of unspecified precerebral artery: Secondary | ICD-10-CM | POA: Diagnosis not present

## 2020-10-02 NOTE — Progress Notes (Signed)
Carelink Summary Report / Loop Recorder 

## 2020-10-30 ENCOUNTER — Ambulatory Visit (INDEPENDENT_AMBULATORY_CARE_PROVIDER_SITE_OTHER): Payer: BC Managed Care – PPO

## 2020-10-30 DIAGNOSIS — I6300 Cerebral infarction due to thrombosis of unspecified precerebral artery: Secondary | ICD-10-CM | POA: Diagnosis not present

## 2020-10-31 LAB — CUP PACEART REMOTE DEVICE CHECK
Date Time Interrogation Session: 20211220223413
Implantable Pulse Generator Implant Date: 20181009

## 2020-11-13 NOTE — Progress Notes (Signed)
Carelink Summary Report / Loop Recorder 

## 2020-11-30 ENCOUNTER — Ambulatory Visit (INDEPENDENT_AMBULATORY_CARE_PROVIDER_SITE_OTHER): Payer: BC Managed Care – PPO

## 2020-11-30 DIAGNOSIS — I6300 Cerebral infarction due to thrombosis of unspecified precerebral artery: Secondary | ICD-10-CM | POA: Diagnosis not present

## 2020-11-30 LAB — CUP PACEART REMOTE DEVICE CHECK
Date Time Interrogation Session: 20220120223831
Implantable Pulse Generator Implant Date: 20181009

## 2020-12-11 ENCOUNTER — Telehealth: Payer: Self-pay | Admitting: Internal Medicine

## 2020-12-11 NOTE — Telephone Encounter (Signed)
Patient has decided to keep his home monitor at this time. Patient was explained the importance of being monitored for A-fib and the possible risk of and complications a CVA. Patient was also advised that his monitor was working. Last transmission received on 12/04/2020 with next transmission on 12/31/2020. Patient verbalized understanding. Patient was advised if any alerts were received or when his device reached RRT that the [facility] would contact him. Device number given to patient and patient was told to contact [facility] directly with any issues or concerns at [phone].

## 2020-12-11 NOTE — Telephone Encounter (Signed)
Pt called in and stated that he would like to get rid of this heart monitor.  He stated it really has not been working and the bill keeps "rolling in" .  He stated he some more important things to focus on other then the monitor and would like to see how he can do away with it  Best number [phone]

## 2020-12-12 NOTE — Progress Notes (Signed)
Carelink Summary Report / Loop Recorder 

## 2020-12-31 ENCOUNTER — Ambulatory Visit (INDEPENDENT_AMBULATORY_CARE_PROVIDER_SITE_OTHER): Payer: BC Managed Care – PPO

## 2020-12-31 DIAGNOSIS — I6300 Cerebral infarction due to thrombosis of unspecified precerebral artery: Secondary | ICD-10-CM

## 2020-12-31 LAB — CUP PACEART REMOTE DEVICE CHECK
Date Time Interrogation Session: 20220220224325
Implantable Pulse Generator Implant Date: 20181009

## 2021-01-03 NOTE — Progress Notes (Signed)
Carelink Summary Report / Loop Recorder 

## 2021-01-31 ENCOUNTER — Ambulatory Visit (INDEPENDENT_AMBULATORY_CARE_PROVIDER_SITE_OTHER): Payer: BC Managed Care – PPO

## 2021-01-31 DIAGNOSIS — I6300 Cerebral infarction due to thrombosis of unspecified precerebral artery: Secondary | ICD-10-CM | POA: Diagnosis not present

## 2021-01-31 LAB — CUP PACEART REMOTE DEVICE CHECK
Date Time Interrogation Session: 20220323234403
Implantable Pulse Generator Implant Date: 20181009

## 2021-02-11 NOTE — Progress Notes (Signed)
Carelink Summary Report / Loop Recorder 

## 2021-02-19 DIAGNOSIS — Z6839 Body mass index (BMI) 39.0-39.9, adult: Secondary | ICD-10-CM | POA: Diagnosis not present

## 2021-02-19 DIAGNOSIS — Z125 Encounter for screening for malignant neoplasm of prostate: Secondary | ICD-10-CM | POA: Diagnosis not present

## 2021-02-19 DIAGNOSIS — Z0000 Encounter for general adult medical examination without abnormal findings: Secondary | ICD-10-CM | POA: Diagnosis not present

## 2021-02-19 DIAGNOSIS — Z13220 Encounter for screening for lipoid disorders: Secondary | ICD-10-CM | POA: Diagnosis not present

## 2021-02-19 DIAGNOSIS — Z1331 Encounter for screening for depression: Secondary | ICD-10-CM | POA: Diagnosis not present

## 2021-02-28 DIAGNOSIS — D2239 Melanocytic nevi of other parts of face: Secondary | ICD-10-CM | POA: Diagnosis not present

## 2021-02-28 DIAGNOSIS — L570 Actinic keratosis: Secondary | ICD-10-CM | POA: Diagnosis not present

## 2021-02-28 DIAGNOSIS — D225 Melanocytic nevi of trunk: Secondary | ICD-10-CM | POA: Diagnosis not present

## 2021-02-28 DIAGNOSIS — D1801 Hemangioma of skin and subcutaneous tissue: Secondary | ICD-10-CM | POA: Diagnosis not present

## 2021-02-28 DIAGNOSIS — D485 Neoplasm of uncertain behavior of skin: Secondary | ICD-10-CM | POA: Diagnosis not present

## 2021-02-28 DIAGNOSIS — L821 Other seborrheic keratosis: Secondary | ICD-10-CM | POA: Diagnosis not present

## 2021-03-04 ENCOUNTER — Ambulatory Visit (INDEPENDENT_AMBULATORY_CARE_PROVIDER_SITE_OTHER): Payer: BC Managed Care – PPO

## 2021-03-04 DIAGNOSIS — I6300 Cerebral infarction due to thrombosis of unspecified precerebral artery: Secondary | ICD-10-CM | POA: Diagnosis not present

## 2021-03-04 LAB — CUP PACEART REMOTE DEVICE CHECK
Date Time Interrogation Session: 20220423234444
Implantable Pulse Generator Implant Date: 20181009

## 2021-03-20 NOTE — Progress Notes (Signed)
Carelink Summary Report / Loop Recorder 

## 2021-04-04 ENCOUNTER — Ambulatory Visit (INDEPENDENT_AMBULATORY_CARE_PROVIDER_SITE_OTHER): Payer: BC Managed Care – PPO

## 2021-04-04 DIAGNOSIS — I6300 Cerebral infarction due to thrombosis of unspecified precerebral artery: Secondary | ICD-10-CM

## 2021-04-04 LAB — CUP PACEART REMOTE DEVICE CHECK
Date Time Interrogation Session: 20220524234745
Implantable Pulse Generator Implant Date: 20181009

## 2021-04-29 NOTE — Progress Notes (Signed)
Carelink Summary Report / Loop Recorder 

## 2021-05-04 LAB — CUP PACEART REMOTE DEVICE CHECK
Date Time Interrogation Session: 20220624234805
Implantable Pulse Generator Implant Date: 20181009

## 2021-05-06 ENCOUNTER — Ambulatory Visit (INDEPENDENT_AMBULATORY_CARE_PROVIDER_SITE_OTHER): Payer: BC Managed Care – PPO

## 2021-05-06 DIAGNOSIS — I6300 Cerebral infarction due to thrombosis of unspecified precerebral artery: Secondary | ICD-10-CM | POA: Diagnosis not present

## 2021-05-22 ENCOUNTER — Telehealth: Payer: Self-pay

## 2021-05-22 NOTE — Progress Notes (Signed)
Carelink Summary Report / Loop Recorder 

## 2021-05-22 NOTE — Telephone Encounter (Signed)
Carelink alert received for ILR at RRT. Unsuccessful telephone call to patient to discuss. Hip[employer] compliant VM message left requesting call back to [phone].

## 2021-05-23 NOTE — Telephone Encounter (Signed)
2nd attempt to contact patient. No answer, LMTCB. 

## 2021-05-30 NOTE — Telephone Encounter (Signed)
3rd Attempt to contact patient. No answer, LMOVM.   Certified letter sent.

## 2021-06-06 ENCOUNTER — Ambulatory Visit (INDEPENDENT_AMBULATORY_CARE_PROVIDER_SITE_OTHER): Payer: BC Managed Care – PPO

## 2021-06-06 DIAGNOSIS — I6300 Cerebral infarction due to thrombosis of unspecified precerebral artery: Secondary | ICD-10-CM

## 2021-06-06 LAB — CUP PACEART REMOTE DEVICE CHECK
Date Time Interrogation Session: 20220725235034
Implantable Pulse Generator Implant Date: 20181009

## 2021-07-02 NOTE — Progress Notes (Signed)
Carelink Summary Report / Loop Recorder 

## 2021-07-08 ENCOUNTER — Ambulatory Visit (INDEPENDENT_AMBULATORY_CARE_PROVIDER_SITE_OTHER): Payer: BC Managed Care – PPO

## 2021-07-08 DIAGNOSIS — I6300 Cerebral infarction due to thrombosis of unspecified precerebral artery: Secondary | ICD-10-CM | POA: Diagnosis not present

## 2021-07-08 LAB — CUP PACEART REMOTE DEVICE CHECK
Date Time Interrogation Session: 20220825235435
Implantable Pulse Generator Implant Date: 20181009

## 2021-07-18 NOTE — Progress Notes (Signed)
Carelink Summary Report / Loop Recorder 

## 2021-09-03 IMAGING — DX DG CHEST 1V PORT
1 series · 1 of 1 positions shown · non-contrast
Comparison: August 11, 2017

CLINICAL DATA: Food impaction

EXAM:
PORTABLE CHEST 1 VIEW

[chest]
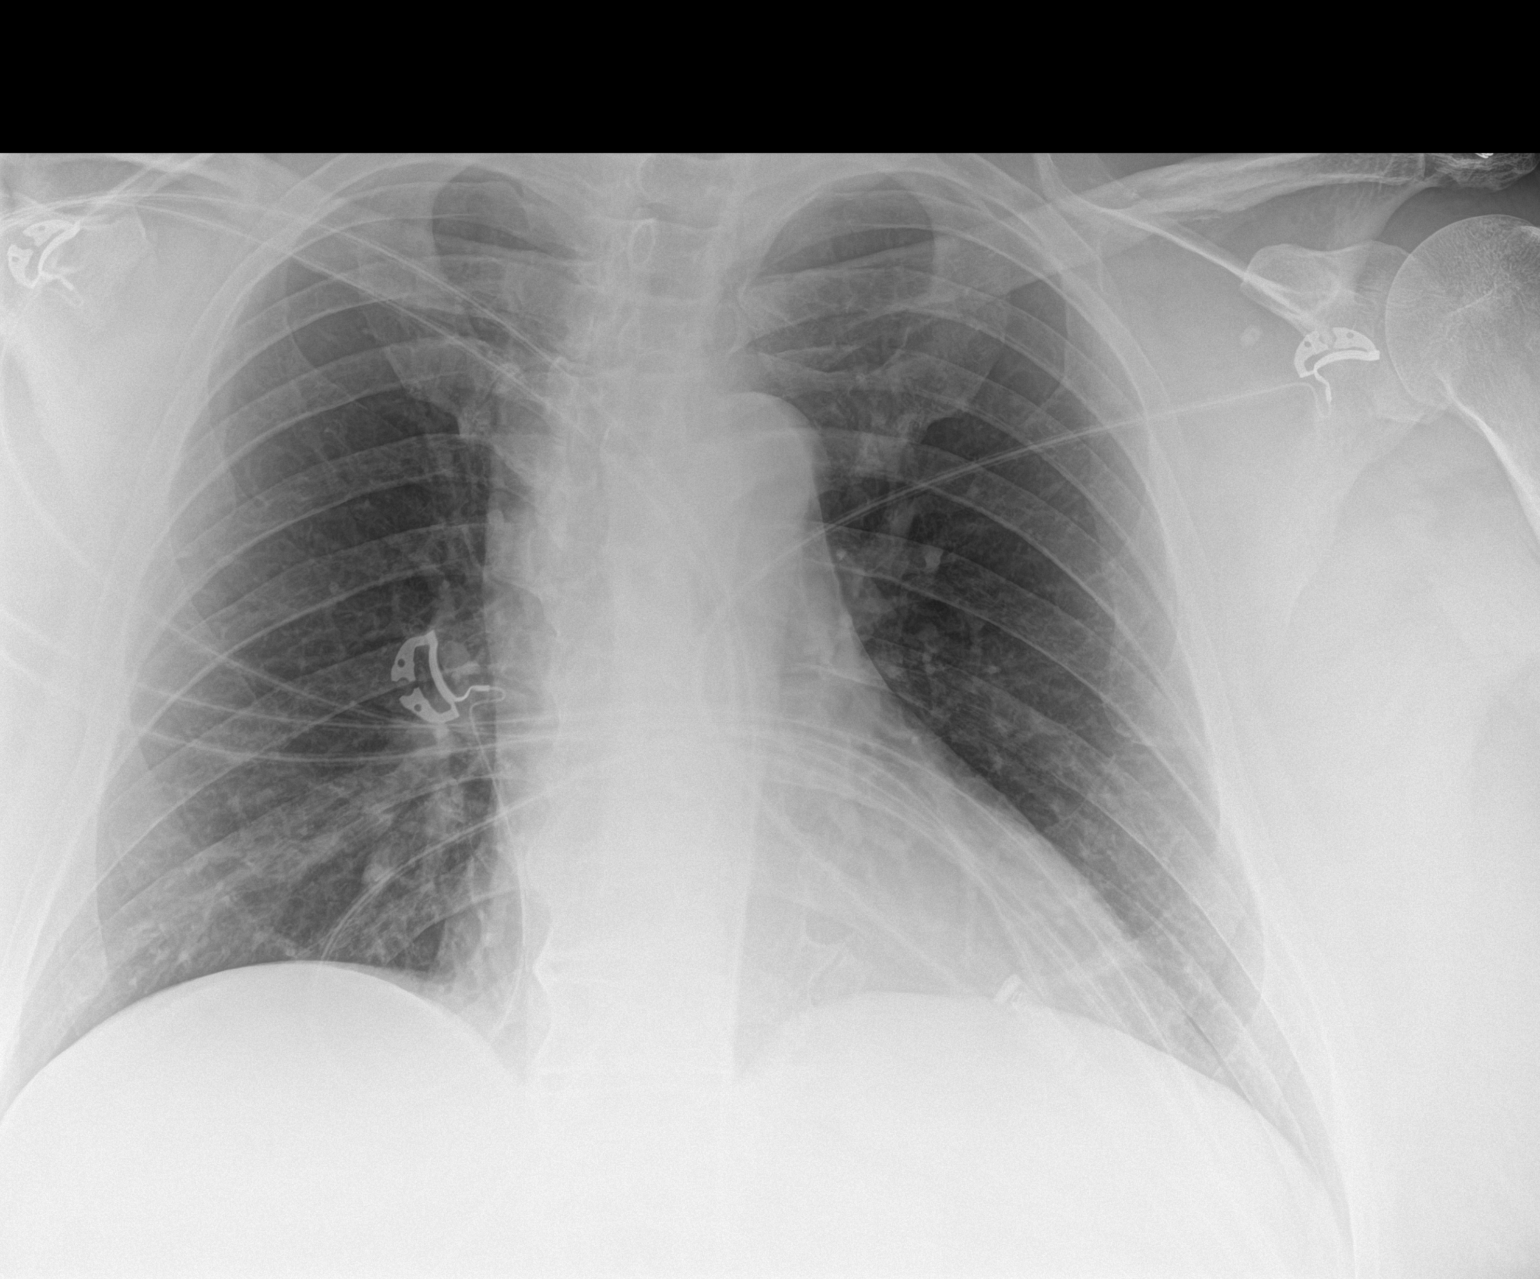

[1 of 1 positions shown; findings below may reference images not displayed]

FINDINGS: The heart size and mediastinal contours are within normal limits.
Both lungs are clear. The visualized skeletal structures are
unremarkable.
IMPRESSION: No active disease.

## 2021-10-10 ENCOUNTER — Ambulatory Visit: Payer: Self-pay

## 2021-10-21 NOTE — Progress Notes (Signed)
Carelink Summary Report / Loop Recorder 

## 2022-09-24 ENCOUNTER — Telehealth: Payer: Self-pay | Admitting: Gastroenterology

## 2022-09-24 NOTE — Telephone Encounter (Signed)
No problems by me. Can schedule in APP clinic or mine, whichever is faster RG

## 2022-09-24 NOTE — Telephone Encounter (Signed)
Hi Dr. Lyndel Safe,   Patient's wife called states she would like patient to transfer his care over to you, she's currently a patient of yours. Patient had a procedure at [facility] with Dr. Otis Brace in 2021. Records are in Epic and Path for review.  Please advise on scheduling.   Thank you

## 2022-09-24 NOTE — Telephone Encounter (Signed)
Spoke with patient's wife & scheduled patient for OV with Dr. Lyndel Safe on 10/29/22 at 10:00 am. Address provided & advised her on where to go for appointment.

## 2022-10-29 ENCOUNTER — Ambulatory Visit: Payer: BC Managed Care – PPO | Admitting: Gastroenterology

## 2022-12-11 ENCOUNTER — Ambulatory Visit: Payer: BC Managed Care – PPO | Admitting: Gastroenterology

## 2023-02-27 ENCOUNTER — Encounter: Payer: Self-pay | Admitting: Gastroenterology

## 2023-02-27 ENCOUNTER — Ambulatory Visit (INDEPENDENT_AMBULATORY_CARE_PROVIDER_SITE_OTHER): Payer: No Typology Code available for payment source | Admitting: Gastroenterology

## 2023-02-27 VITALS — BP 124/76 | HR 84 | Ht 73.0 in | Wt 260.0 lb

## 2023-02-27 DIAGNOSIS — Z1212 Encounter for screening for malignant neoplasm of rectum: Secondary | ICD-10-CM

## 2023-02-27 DIAGNOSIS — K219 Gastro-esophageal reflux disease without esophagitis: Secondary | ICD-10-CM

## 2023-02-27 DIAGNOSIS — K449 Diaphragmatic hernia without obstruction or gangrene: Secondary | ICD-10-CM | POA: Diagnosis not present

## 2023-02-27 DIAGNOSIS — Z1211 Encounter for screening for malignant neoplasm of colon: Secondary | ICD-10-CM | POA: Diagnosis not present

## 2023-02-27 MED ORDER — NA SULFATE-K SULFATE-MG SULF 17.5-3.13-1.6 GM/177ML PO SOLN: 1.0000 | Freq: Once | ORAL | 0 refills | Status: AC

## 2023-02-27 NOTE — Patient Instructions (Addendum)
_______________________________________________________  If your blood pressure at your visit was 140/90 or greater, please contact your primary care physician to follow up on this.  _______________________________________________________  If you are age 61 or older, your body mass index should be between 23-30. Your Body mass index is 34.3 kg/m. If this is out of the aforementioned range listed, please consider follow up with your Primary Care Provider.  If you are age 28 or younger, your body mass index should be between 19-25. Your Body mass index is 34.3 kg/m. If this is out of the aformentioned range listed, please consider follow up with your Primary Care Provider.   ________________________________________________________  The [facility] providers would like to encourage you to use [facility] to communicate with providers for non-urgent requests or questions.  Due to long hold times on the telephone, sending your provider a message by [facility] may be a faster and more efficient way to get a response.  Please allow 48 business hours for a response.  Please remember that this is for non-urgent requests.  _______________________________________________________  Two days before your procedure: Mix 3 packs (or capfuls) of Miralax in 48 ounces of clear liquid and drink at 6pm.  You have been scheduled for a colonoscopy. Please follow written instructions given to you at your visit today.  Please pick up your prep supplies at the pharmacy within the next 1-3 days. If you use inhalers (even only as needed), please bring them with you on the day of your procedure.  Continue Protonix.  We have sent the following medications to your pharmacy for you to pick up at your convenience: Suprep   Thank you,  Dr. Lynann Bologna
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
## 2023-03-02 ENCOUNTER — Telehealth: Payer: Self-pay | Admitting: Gastroenterology

## 2023-03-02 NOTE — Telephone Encounter (Signed)
Pharmacy is calling about prep medication. He has no insurance and was prescribed Suprep. Wants to know if he can be prescribed gaviliyte G and she needs him to have new prep instructions sent to mychart once changed.

## 2023-03-11 NOTE — Telephone Encounter (Unsigned)
Left message for pt to call back: Will offer Miralax for prep.

## 2023-03-12 NOTE — Telephone Encounter (Signed)
Spoke with patient & he is agreeable to miralax prep d/t the expense of the suprep medication that was sent in. Updated prep instructions have been sent to mychart & to home address. He verbalizes all understanding.

## 2023-04-24 ENCOUNTER — Encounter: Payer: Self-pay | Admitting: Gastroenterology

## 2023-04-24 ENCOUNTER — Ambulatory Visit: Payer: No Typology Code available for payment source | Admitting: Gastroenterology

## 2023-04-24 VITALS — BP 118/73 | HR 68 | Temp 98.2°F | Resp 17 | Ht 73.0 in | Wt 260.0 lb

## 2023-04-24 DIAGNOSIS — D123 Benign neoplasm of transverse colon: Secondary | ICD-10-CM | POA: Diagnosis not present

## 2023-04-24 DIAGNOSIS — D125 Benign neoplasm of sigmoid colon: Secondary | ICD-10-CM

## 2023-04-24 DIAGNOSIS — D124 Benign neoplasm of descending colon: Secondary | ICD-10-CM | POA: Diagnosis not present

## 2023-04-24 DIAGNOSIS — D128 Benign neoplasm of rectum: Secondary | ICD-10-CM | POA: Diagnosis not present

## 2023-04-24 DIAGNOSIS — Z1211 Encounter for screening for malignant neoplasm of colon: Secondary | ICD-10-CM

## 2023-04-24 MED ORDER — SODIUM CHLORIDE 0.9 % IV SOLN: 500.0000 mL | Freq: Once | INTRAVENOUS | Status: DC

## 2023-04-24 NOTE — Progress Notes (Signed)
Called to room to assist during endoscopic procedure.  Patient ID and intended procedure confirmed with present staff. Received instructions for my participation in the procedure from the performing physician.  

## 2023-04-24 NOTE — Patient Instructions (Signed)
Await pathology results.  No aspirin, ibuprofen, naproxen, or other non-steroidal anti-inflammatory drugs for 5 days after polyp removal.  Handouts on polyps and diverticulosis provided.  YOU HAD AN ENDOSCOPIC PROCEDURE TODAY AT THE [facility]:   Refer to the procedure report that was given to you for any specific questions about what was found during the examination.  If the procedure report does not answer your questions, please call your gastroenterologist to clarify.  If you requested that your care partner not be given the details of your procedure findings, then the procedure report has been included in a sealed envelope for you to review at your convenience later.  YOU SHOULD EXPECT: Some feelings of bloating in the abdomen. Passage of more gas than usual.  Walking can help get rid of the air that was put into your GI tract during the procedure and reduce the bloating. If you had a lower endoscopy (such as a colonoscopy or flexible sigmoidoscopy) you may notice spotting of blood in your stool or on the toilet paper. If you underwent a bowel prep for your procedure, you may not have a normal bowel movement for a few days.  Please Note:  You might notice some irritation and congestion in your nose or some drainage.  This is from the oxygen used during your procedure.  There is no need for concern and it should clear up in a day or so.  SYMPTOMS TO REPORT IMMEDIATELY:  Following lower endoscopy (colonoscopy or flexible sigmoidoscopy):  Excessive amounts of blood in the stool  Significant tenderness or worsening of abdominal pains  Swelling of the abdomen that is new, acute  Fever of 100F or higher  For urgent or emergent issues, a gastroenterologist can be reached at any hour by calling [phone]. Do not use MyChart messaging for urgent concerns.    DIET:  We do recommend a small meal at first, but then you may proceed to your regular diet.  Drink plenty of fluids but  you should avoid alcoholic beverages for 24 hours.  ACTIVITY:  You should plan to take it easy for the rest of today and you should NOT DRIVE or use heavy machinery until tomorrow (because of the sedation medicines used during the test).    FOLLOW UP: Our staff will call the number listed on your records the next business day following your procedure.  We will call around 7:15- 8:00 am to check on you and address any questions or concerns that you may have regarding the information given to you following your procedure. If we do not reach you, we will leave a message.     If any biopsies were taken you will be contacted by phone or by letter within the next 1-3 weeks.  Please call us at [phone] if you have not heard about the biopsies in 3 weeks.    SIGNATURES/CONFIDENTIALITY: You and/or your care partner have signed paperwork which will be entered into your electronic medical record.  These signatures attest to the fact that that the information above on your After Visit Summary has been reviewed and is understood.  Full responsibility of the confidentiality of this discharge information lies with you and/or your care-partner.

## 2023-04-24 NOTE — Op Note (Signed)
[facility] Patient Name: Justin [name] Procedure Date: 04/24/2023 3:19 PM MRN: [id number] Endoscopist: Lynann Bologna , MD, [id number] Age: 62 Referring MD:  Date of Birth: September 14, 1961 Gender: Male Account #: [id number] Procedure:                Colonoscopy Indications:              Screening for colorectal malignant neoplasm Medicines:                Monitored Anesthesia Care Procedure:                Pre-Anesthesia Assessment:                           - Prior to the procedure, a History and Physical                            was performed, and patient medications and                            allergies were reviewed. The patient's tolerance of                            previous anesthesia was also reviewed. The risks                            and benefits of the procedure and the sedation                            options and risks were discussed with the patient.                            All questions were answered, and informed consent                            was obtained. Prior Anticoagulants: The patient has                            taken no anticoagulant or antiplatelet agents. ASA                            Grade Assessment: II - A patient with mild systemic                            disease. After reviewing the risks and benefits,                            the patient was deemed in satisfactory condition to                            undergo the procedure.                           After obtaining informed consent, the colonoscope  was passed under direct vision. Throughout the                            procedure, the patient's blood pressure, pulse, and                            oxygen saturations were monitored continuously. The                            CF [id number] #[id number] was introduced through the anus                            and advanced to the the cecum, identified by                            appendiceal orifice and  ileocecal valve. The                            colonoscopy was performed without difficulty. The                            patient tolerated the procedure well. The quality                            of the bowel preparation was good. The ileocecal                            valve, appendiceal orifice, and rectum were                            photographed. Scope In: 3:29:48 PM Scope Out: 3:53:42 PM Scope Withdrawal Time: 0 hours 20 minutes 39 seconds  Total Procedure Duration: 0 hours 23 minutes 54 seconds  Findings:                 Five sessile polyps were found in the proximal                            descending colon, mid descending colon, distal                            descending colon, proximal transverse colon and mid                            transverse colon. The polyps were 6 to 8 mm in                            size. These polyps were removed with a cold snare.                            Resection and retrieval were complete.                           A 4 mm polyp was found in the  rectum. The polyp was                            sessile. The polyp was removed with a cold snare.                            Resection and retrieval were complete.                           A few medium-mouthed diverticula were found in the                            sigmoid colon.                           Non-bleeding internal hemorrhoids were found during                            retroflexion. The hemorrhoids were small and Grade                            I (internal hemorrhoids that do not prolapse).                           The exam was otherwise without abnormality on                            direct and retroflexion views. Complications:            No immediate complications. Estimated Blood Loss:     Estimated blood loss: none. Impression:               - Five 6 to 8 mm polyps in the proximal descending                            colon, in the mid descending colon, in the  distal                            descending colon, in the proximal transverse colon                            and in the mid transverse colon, removed with a                            cold snare. Resected and retrieved.                           - One 4 mm polyp in the rectum, removed with a cold                            snare. Resected and retrieved.                           - Mild sigmoid diverticulosis.                           -  Non-bleeding internal hemorrhoids.                           - The examination was otherwise normal on direct                            and retroflexion views. Recommendation:           - Patient has a contact number available for                            emergencies. The signs and symptoms of potential                            delayed complications were discussed with the                            patient. Return to normal activities tomorrow.                            Written discharge instructions were provided to the                            patient.                           - Resume previous diet.                           - Continue present medications.                           - No aspirin, ibuprofen, naproxen, or other                            non-steroidal anti-inflammatory drugs for 5 days                            after polyp removal.                           - Await pathology results.                           - Repeat colonoscopy for surveillance based on                            pathology results.                           - The findings and recommendations were discussed                            with the patient's family. Lynann Bologna, MD 04/24/2023 3:58:22 PM This report has been signed electronically.
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
## 2023-04-24 NOTE — Progress Notes (Signed)
Report to PACU, RN, vss, BBS= Clear.  

## 2023-04-27 ENCOUNTER — Telehealth: Payer: Self-pay

## 2023-04-27 NOTE — Telephone Encounter (Signed)
  Follow up Call-     04/24/2023    2:33 PM  Call back number  Post procedure Call Back phone  # [phone]  Permission to leave phone message Yes     Patient questions:  Do you have a fever, pain , or abdominal swelling? No. Pain Score  0 *  Have you tolerated food without any problems? Yes.    Have you been able to return to your normal activities? Yes.    Do you have any questions about your discharge instructions: Diet   No. Medications  No. Follow up visit  No.  Do you have questions or concerns about your Care? No.  Actions: * If pain score is 4 or above: No action needed, pain <4.

## 2023-04-30 ENCOUNTER — Encounter: Payer: Self-pay | Admitting: Gastroenterology

## 2023-10-20 DIAGNOSIS — N63 Unspecified lump in unspecified breast: Secondary | ICD-10-CM | POA: Diagnosis not present

## 2023-10-20 DIAGNOSIS — Z23 Encounter for immunization: Secondary | ICD-10-CM | POA: Diagnosis not present

## 2023-10-20 DIAGNOSIS — E7800 Pure hypercholesterolemia, unspecified: Secondary | ICD-10-CM | POA: Diagnosis not present

## 2023-10-20 DIAGNOSIS — N644 Mastodynia: Secondary | ICD-10-CM | POA: Diagnosis not present

## 2023-10-20 DIAGNOSIS — I10 Essential (primary) hypertension: Secondary | ICD-10-CM | POA: Diagnosis not present

## 2023-11-26 DIAGNOSIS — N644 Mastodynia: Secondary | ICD-10-CM | POA: Diagnosis not present

## 2023-12-15 DIAGNOSIS — S82831A Other fracture of upper and lower end of right fibula, initial encounter for closed fracture: Secondary | ICD-10-CM | POA: Diagnosis not present

## 2023-12-15 DIAGNOSIS — S82301A Unspecified fracture of lower end of right tibia, initial encounter for closed fracture: Secondary | ICD-10-CM | POA: Diagnosis not present

## 2023-12-22 DIAGNOSIS — D485 Neoplasm of uncertain behavior of skin: Secondary | ICD-10-CM | POA: Diagnosis not present

## 2023-12-22 DIAGNOSIS — D225 Melanocytic nevi of trunk: Secondary | ICD-10-CM | POA: Diagnosis not present

## 2023-12-22 DIAGNOSIS — L570 Actinic keratosis: Secondary | ICD-10-CM | POA: Diagnosis not present

## 2023-12-22 DIAGNOSIS — L814 Other melanin hyperpigmentation: Secondary | ICD-10-CM | POA: Diagnosis not present

## 2023-12-22 DIAGNOSIS — D2239 Melanocytic nevi of other parts of face: Secondary | ICD-10-CM | POA: Diagnosis not present

## 2023-12-22 DIAGNOSIS — L821 Other seborrheic keratosis: Secondary | ICD-10-CM | POA: Diagnosis not present

## 2024-01-06 DIAGNOSIS — C44222 Squamous cell carcinoma of skin of right ear and external auricular canal: Secondary | ICD-10-CM | POA: Diagnosis not present

## 2024-01-13 DIAGNOSIS — C44719 Basal cell carcinoma of skin of left lower limb, including hip: Secondary | ICD-10-CM | POA: Diagnosis not present

## 2024-04-22 DIAGNOSIS — E7800 Pure hypercholesterolemia, unspecified: Secondary | ICD-10-CM | POA: Diagnosis not present

## 2024-04-22 DIAGNOSIS — I10 Essential (primary) hypertension: Secondary | ICD-10-CM | POA: Diagnosis not present

## 2024-07-04 DIAGNOSIS — Z6841 Body Mass Index (BMI) 40.0 and over, adult: Secondary | ICD-10-CM | POA: Diagnosis not present

## 2024-07-04 DIAGNOSIS — L03213 Periorbital cellulitis: Secondary | ICD-10-CM | POA: Diagnosis not present

## 2024-08-29 DIAGNOSIS — I10 Essential (primary) hypertension: Secondary | ICD-10-CM | POA: Diagnosis not present

## 2024-08-29 DIAGNOSIS — E7800 Pure hypercholesterolemia, unspecified: Secondary | ICD-10-CM | POA: Diagnosis not present
